# Patient Record
Sex: Male | Born: 1975 | Race: White | Hispanic: No | Marital: Married | Smoking: Former smoker
Health system: Southern US, Community
[De-identification: ages and names within clinical notes are randomized; demographics above are authoritative.]

## PROBLEM LIST (undated history)

## (undated) HISTORY — PX: SHOULDER SURGERY: SHX246

## (undated) HISTORY — PX: CLOSED REDUCTION NASAL FRACTURE: SUR256

## (undated) HISTORY — PX: TONSILLECTOMY: SUR1361

## (undated) HISTORY — PX: ANKLE FRACTURE SURGERY: SHX122

---

## 1998-04-09 ENCOUNTER — Ambulatory Visit (HOSPITAL_BASED_OUTPATIENT_CLINIC_OR_DEPARTMENT_OTHER): Admission: RE | Admit: 1998-04-09 | Discharge: 1998-04-09 | Payer: Self-pay | Admitting: Orthopedic Surgery

## 1999-06-14 ENCOUNTER — Emergency Department (HOSPITAL_COMMUNITY): Admission: EM | Admit: 1999-06-14 | Discharge: 1999-06-14 | Payer: Self-pay

## 2001-02-17 ENCOUNTER — Emergency Department (HOSPITAL_COMMUNITY): Admission: EM | Admit: 2001-02-17 | Discharge: 2001-02-18 | Payer: Self-pay | Admitting: *Deleted

## 2001-02-17 ENCOUNTER — Encounter: Payer: Self-pay | Admitting: *Deleted

## 2018-11-11 ENCOUNTER — Encounter: Payer: Self-pay | Admitting: Emergency Medicine

## 2018-11-11 DIAGNOSIS — F988 Other specified behavioral and emotional disorders with onset usually occurring in childhood and adolescence: Secondary | ICD-10-CM | POA: Insufficient documentation

## 2018-11-11 DIAGNOSIS — F32A Depression, unspecified: Secondary | ICD-10-CM | POA: Insufficient documentation

## 2018-11-11 DIAGNOSIS — F419 Anxiety disorder, unspecified: Secondary | ICD-10-CM

## 2018-11-11 DIAGNOSIS — F329 Major depressive disorder, single episode, unspecified: Secondary | ICD-10-CM | POA: Insufficient documentation

## 2018-11-15 ENCOUNTER — Other Ambulatory Visit: Payer: Self-pay

## 2018-11-15 MED ORDER — ALPRAZOLAM 0.5 MG PO TABS: 0.5000 mg | ORAL_TABLET | Freq: Every day | ORAL | 0 refills | Status: DC | PRN

## 2018-11-24 ENCOUNTER — Ambulatory Visit: Payer: Self-pay | Admitting: Psychiatry

## 2018-12-28 ENCOUNTER — Ambulatory Visit: Payer: Self-pay | Admitting: Psychiatry

## 2018-12-28 ENCOUNTER — Encounter: Payer: Self-pay | Admitting: Psychiatry

## 2018-12-28 VITALS — BP 122/69 | HR 59

## 2018-12-28 DIAGNOSIS — F329 Major depressive disorder, single episode, unspecified: Secondary | ICD-10-CM

## 2018-12-28 DIAGNOSIS — F32A Depression, unspecified: Secondary | ICD-10-CM

## 2018-12-28 DIAGNOSIS — F902 Attention-deficit hyperactivity disorder, combined type: Secondary | ICD-10-CM

## 2018-12-28 DIAGNOSIS — F411 Generalized anxiety disorder: Secondary | ICD-10-CM

## 2018-12-28 MED ORDER — AMPHETAMINE-DEXTROAMPHET ER 25 MG PO CP24: 25.0000 mg | ORAL_CAPSULE | ORAL | 0 refills | Status: DC

## 2018-12-28 MED ORDER — SILDENAFIL CITRATE 100 MG PO TABS: 50.0000 mg | ORAL_TABLET | Freq: Every day | ORAL | 0 refills | Status: DC | PRN

## 2018-12-28 MED ORDER — SERTRALINE HCL 50 MG PO TABS: 50.0000 mg | ORAL_TABLET | Freq: Every day | ORAL | 1 refills | Status: DC

## 2018-12-28 MED ORDER — ALPRAZOLAM 0.5 MG PO TABS: 0.5000 mg | ORAL_TABLET | Freq: Every day | ORAL | 0 refills | Status: DC | PRN

## 2018-12-28 MED ORDER — BUPROPION HCL ER (XL) 150 MG PO TB24: 150.0000 mg | ORAL_TABLET | ORAL | 1 refills | Status: DC
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
## 2018-12-29 ENCOUNTER — Encounter: Payer: Self-pay | Admitting: Psychiatry

## 2018-12-30 ENCOUNTER — Telehealth: Payer: Self-pay | Admitting: Psychiatry

## 2018-12-30 DIAGNOSIS — F411 Generalized anxiety disorder: Secondary | ICD-10-CM

## 2018-12-30 DIAGNOSIS — G2581 Restless legs syndrome: Secondary | ICD-10-CM

## 2018-12-30 DIAGNOSIS — G4700 Insomnia, unspecified: Secondary | ICD-10-CM

## 2018-12-30 MED ORDER — GABAPENTIN 300 MG PO CAPS: 300.0000 mg | ORAL_CAPSULE | Freq: Every day | ORAL | 3 refills | Status: DC

## 2018-12-30 NOTE — Telephone Encounter (Signed)
PATIENT LEFT VM STATING A SCRIPT WAS SUPPOSE TO BE CALLED INTO [facility] FOR HIM TO SLEEP, THE SCRIPT HAS NOT BEEN SENT PLEASE SENT TO [facility] ON [address], PT GOING OUT OF TOWN

## 2019-02-11 ENCOUNTER — Other Ambulatory Visit: Payer: Self-pay

## 2019-02-11 ENCOUNTER — Emergency Department (HOSPITAL_COMMUNITY)
Admission: EM | Admit: 2019-02-11 | Discharge: 2019-02-12 | Disposition: A | Discharge status: Home / Self Care | Payer: Self-pay | Attending: Emergency Medicine | Admitting: Emergency Medicine

## 2019-02-11 ENCOUNTER — Encounter (HOSPITAL_COMMUNITY): Payer: Self-pay

## 2019-02-11 DIAGNOSIS — Z79899 Other long term (current) drug therapy: Secondary | ICD-10-CM | POA: Insufficient documentation

## 2019-02-11 DIAGNOSIS — S0992XA Unspecified injury of nose, initial encounter: Secondary | ICD-10-CM

## 2019-02-11 DIAGNOSIS — Y999 Unspecified external cause status: Secondary | ICD-10-CM | POA: Insufficient documentation

## 2019-02-11 DIAGNOSIS — Z87891 Personal history of nicotine dependence: Secondary | ICD-10-CM | POA: Insufficient documentation

## 2019-02-11 DIAGNOSIS — Y939 Activity, unspecified: Secondary | ICD-10-CM | POA: Insufficient documentation

## 2019-02-11 DIAGNOSIS — S01112A Laceration without foreign body of left eyelid and periocular area, initial encounter: Secondary | ICD-10-CM | POA: Insufficient documentation

## 2019-02-11 DIAGNOSIS — S022XXA Fracture of nasal bones, initial encounter for closed fracture: Secondary | ICD-10-CM | POA: Insufficient documentation

## 2019-02-11 DIAGNOSIS — Z23 Encounter for immunization: Secondary | ICD-10-CM | POA: Insufficient documentation

## 2019-02-11 DIAGNOSIS — Y9289 Other specified places as the place of occurrence of the external cause: Secondary | ICD-10-CM | POA: Insufficient documentation

## 2019-02-11 MED ORDER — LIDOCAINE-EPINEPHRINE (PF) 2 %-1:200000 IJ SOLN
10.0000 mL | Freq: Once | INTRAMUSCULAR | Status: AC
  Administered 2019-02-12: 10 mL
  Filled 2019-02-11: qty 20

## 2019-02-11 MED ORDER — TETANUS-DIPHTH-ACELL PERTUSSIS 5-2.5-18.5 LF-MCG/0.5 IM SUSP
0.5000 mL | Freq: Once | INTRAMUSCULAR | Status: AC
  Administered 2019-02-12: 0.5 mL via INTRAMUSCULAR
  Filled 2019-02-11: qty 0.5
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
## 2019-02-11 NOTE — ED Notes (Signed)
Pt states he just "wants his nose fixed" so he can go

## 2019-02-12 ENCOUNTER — Emergency Department (HOSPITAL_COMMUNITY): Payer: Self-pay

## 2019-02-12 MED ORDER — IBUPROFEN 800 MG PO TABS
800.0000 mg | ORAL_TABLET | Freq: Once | ORAL | Status: AC
  Administered 2019-02-12: 800 mg via ORAL
  Filled 2019-02-12: qty 1

## 2019-02-12 MED ORDER — CEPHALEXIN 500 MG PO CAPS: 500.0000 mg | ORAL_CAPSULE | Freq: Four times a day (QID) | ORAL | 0 refills | Status: DC

## 2019-02-12 NOTE — Discharge Instructions (Addendum)
Take Keflex as prescribed until finished. Take Tylenol or ibuprofen for management of pain.  You do not need to have your stitches removed as they will dissolve on their own. Apply ice to areas of pain to limit bruising and swelling. Do not blow your nose for 1 week as this may worsen symptoms. We recommend follow-up with a specialist in 1 week to further evaluate your injuries.
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
## 2019-02-12 NOTE — ED Notes (Signed)
Patient transported to CT 

## 2019-02-14 ENCOUNTER — Other Ambulatory Visit: Payer: Self-pay | Admitting: Psychiatry

## 2019-02-14 DIAGNOSIS — F411 Generalized anxiety disorder: Secondary | ICD-10-CM

## 2019-03-13 ENCOUNTER — Other Ambulatory Visit: Payer: Self-pay | Admitting: Psychiatry

## 2019-03-13 DIAGNOSIS — F32A Depression, unspecified: Secondary | ICD-10-CM

## 2019-03-13 DIAGNOSIS — F329 Major depressive disorder, single episode, unspecified: Secondary | ICD-10-CM

## 2019-03-13 MED ORDER — BUPROPION HCL ER (XL) 150 MG PO TB24: 150.0000 mg | ORAL_TABLET | ORAL | 0 refills | Status: DC

## 2019-03-29 ENCOUNTER — Ambulatory Visit: Payer: Self-pay | Admitting: Psychiatry

## 2019-03-31 ENCOUNTER — Other Ambulatory Visit: Payer: Self-pay | Admitting: Psychiatry

## 2019-04-01 NOTE — Telephone Encounter (Signed)
Has appt 04/21

## 2019-04-04 ENCOUNTER — Ambulatory Visit (INDEPENDENT_AMBULATORY_CARE_PROVIDER_SITE_OTHER): Payer: Managed Care, Other (non HMO) | Admitting: Psychiatry

## 2019-04-04 ENCOUNTER — Encounter: Payer: Self-pay | Admitting: Psychiatry

## 2019-04-04 ENCOUNTER — Other Ambulatory Visit: Payer: Self-pay

## 2019-04-04 DIAGNOSIS — G2581 Restless legs syndrome: Secondary | ICD-10-CM

## 2019-04-04 DIAGNOSIS — F411 Generalized anxiety disorder: Secondary | ICD-10-CM

## 2019-04-04 DIAGNOSIS — G4700 Insomnia, unspecified: Secondary | ICD-10-CM

## 2019-04-04 DIAGNOSIS — F902 Attention-deficit hyperactivity disorder, combined type: Secondary | ICD-10-CM | POA: Diagnosis not present

## 2019-04-04 MED ORDER — AMPHETAMINE-DEXTROAMPHET ER 25 MG PO CP24: 25.0000 mg | ORAL_CAPSULE | ORAL | 0 refills | Status: DC

## 2019-04-04 MED ORDER — SERTRALINE HCL 50 MG PO TABS: 50.0000 mg | ORAL_TABLET | Freq: Every day | ORAL | 1 refills | Status: DC

## 2019-04-04 MED ORDER — GABAPENTIN 300 MG PO CAPS: 300.0000 mg | ORAL_CAPSULE | Freq: Every day | ORAL | 2 refills | Status: DC

## 2019-04-04 MED ORDER — ALPRAZOLAM 0.5 MG PO TABS: 0.5000 mg | ORAL_TABLET | Freq: Every day | ORAL | 1 refills | Status: DC | PRN
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
## 2019-04-08 ENCOUNTER — Encounter: Payer: Self-pay | Admitting: Psychiatry

## 2019-04-18 ENCOUNTER — Other Ambulatory Visit: Payer: Self-pay

## 2019-04-18 MED ORDER — SILDENAFIL CITRATE 50 MG PO TABS: 50.0000 mg | ORAL_TABLET | ORAL | 0 refills | Status: DC | PRN

## 2019-04-18 NOTE — Telephone Encounter (Signed)
Pt requesting a refill on his Viagra and asking for Wellbutrin be sent to [facility]. He was told to call back if he needed.

## 2019-07-04 ENCOUNTER — Ambulatory Visit: Payer: Managed Care, Other (non HMO) | Admitting: Psychiatry

## 2019-07-12 ENCOUNTER — Other Ambulatory Visit: Payer: Self-pay | Admitting: Psychiatry

## 2019-07-12 DIAGNOSIS — F902 Attention-deficit hyperactivity disorder, combined type: Secondary | ICD-10-CM

## 2019-07-12 NOTE — Telephone Encounter (Signed)
Last fill 06/26

## 2019-07-17 ENCOUNTER — Encounter: Payer: Self-pay | Admitting: Psychiatry

## 2019-07-17 ENCOUNTER — Other Ambulatory Visit: Payer: Self-pay

## 2019-07-17 ENCOUNTER — Ambulatory Visit (INDEPENDENT_AMBULATORY_CARE_PROVIDER_SITE_OTHER): Payer: Managed Care, Other (non HMO) | Admitting: Psychiatry

## 2019-07-17 DIAGNOSIS — F32A Depression, unspecified: Secondary | ICD-10-CM

## 2019-07-17 DIAGNOSIS — F411 Generalized anxiety disorder: Secondary | ICD-10-CM

## 2019-07-17 DIAGNOSIS — F329 Major depressive disorder, single episode, unspecified: Secondary | ICD-10-CM

## 2019-07-17 DIAGNOSIS — F902 Attention-deficit hyperactivity disorder, combined type: Secondary | ICD-10-CM | POA: Diagnosis not present

## 2019-07-17 MED ORDER — SILDENAFIL CITRATE 100 MG PO TABS: 50.0000 mg | ORAL_TABLET | ORAL | 5 refills | Status: DC | PRN

## 2019-07-17 MED ORDER — AMPHETAMINE-DEXTROAMPHET ER 25 MG PO CP24: ORAL_CAPSULE | ORAL | 0 refills | Status: DC

## 2019-07-17 MED ORDER — AMPHETAMINE-DEXTROAMPHET ER 25 MG PO CP24: 25.0000 mg | ORAL_CAPSULE | ORAL | 0 refills | Status: DC

## 2019-07-17 MED ORDER — ALPRAZOLAM 0.5 MG PO TABS: 0.5000 mg | ORAL_TABLET | Freq: Every day | ORAL | 2 refills | Status: DC | PRN

## 2019-07-17 MED ORDER — BUPROPION HCL ER (XL) 150 MG PO TB24: 150.0000 mg | ORAL_TABLET | Freq: Every day | ORAL | 0 refills | Status: DC

## 2019-07-17 MED ORDER — SERTRALINE HCL 50 MG PO TABS: 50.0000 mg | ORAL_TABLET | Freq: Every day | ORAL | 1 refills | Status: DC

## 2019-07-17 NOTE — Progress Notes (Signed)
Aaron West [id number] 05-Nov-1976 43 y.o.  Virtual Visit via Telephone Note  I connected with pt on 07/17/19 at 11:30 AM EDT by telephone and verified that I am speaking with the correct person using two identifiers.   I discussed the limitations, risks, security and privacy concerns of performing an evaluation and management service by telephone and the availability of in person appointments. I also discussed with the patient that there may be a patient responsible charge related to this service. The patient expressed understanding and agreed to proceed.   I discussed the assessment and treatment plan with the patient. The patient was provided an opportunity to ask questions and all were answered. The patient agreed with the plan and demonstrated an understanding of the instructions.   The patient was advised to call back or seek an in-person evaluation if the symptoms worsen or if the condition fails to improve as anticipated.  I provided 20 minutes of non-face-to-face time during this encounter.  The patient was located at home.  The provider was located at [place].   Thayer Headings, PMHNP   Subjective:   Patient ID:  Aaron West is a 43 y.o. (DOB 1976-07-26) male.  Chief Complaint:  Chief Complaint  Patient presents with  . Follow-up    ADD, Anxiety, h/o Depression    HPI Aaron West presents for follow-up of ADD, mood, and anxiety. He denies complaints. Reports that anxiety has been well-controlled. Mood has been stable. Denies depressed mood or irritability. Reports that he has not been exercising as often. Energy and motivation ok overall and thinks energy would be better if he exercised regularly. He reports adequate sleep. Reports taking Gabapentin prn when RLS and insomnia occurs. Appetite is stable. He reports adequate concentration. Denies SI.   Work remains very busy.    Review of Systems:  Review of Systems  Cardiovascular: Negative for  palpitations.  Musculoskeletal: Negative for gait problem.  Neurological: Negative for tremors.  Psychiatric/Behavioral:       Please refer to HPI    Medications: I have reviewed the patient's current medications.  Current Outpatient Medications  Medication Sig Dispense Refill  . [START ON 10/04/2019] amphetamine-dextroamphetamine (ADDERALL XR) 25 MG 24 hr capsule TAKE ONE CAPSULE BY MOUTH IN THE MORNING 30 capsule 0  . buPROPion (WELLBUTRIN XL) 150 MG 24 hr tablet Take 1 tablet (150 mg total) by mouth daily. 90 tablet 0  . Multiple Vitamin (MULTIVITAMIN) tablet Take 1 tablet by mouth daily.    . sildenafil (VIAGRA) 100 MG tablet Take 0.5 tablets (50 mg total) by mouth as needed for erectile dysfunction. 10 tablet 5  . ALPRAZolam (XANAX) 0.5 MG tablet Take 1 tablet (0.5 mg total) by mouth daily as needed for anxiety. 30 tablet 2  . [START ON 09/06/2019] amphetamine-dextroamphetamine (ADDERALL XR) 25 MG 24 hr capsule Take 1 capsule by mouth every morning. 30 capsule 0  . amphetamine-dextroamphetamine (ADDERALL XR) 25 MG 24 hr capsule Take 1 capsule by mouth every morning. 30 capsule 0  . gabapentin (NEURONTIN) 300 MG capsule Take 1 capsule (300 mg total) by mouth at bedtime for 30 days. 30 capsule 2  . sertraline (ZOLOFT) 50 MG tablet Take 1 tablet (50 mg total) by mouth at bedtime. 90 tablet 1   No current facility-administered medications for this visit.     Medication Side Effects: None  Allergies:  Allergies  Allergen Reactions  . Penicillins   . Trazodone And Nefazodone     No past medical history  on file.  Family History  Problem Relation Age of Onset  . ADD / ADHD Brother   . Anxiety disorder Brother   . Mental illness Maternal Uncle   . Mental illness Cousin   . ADD / ADHD Daughter   . Depression Daughter   . Drug abuse Daughter   . Depression Daughter     Social History   Socioeconomic History  . Marital status: Married    Spouse name: Not on file  . Number  of children: Not on file  . Years of education: Not on file  . Highest education level: Not on file  Occupational History  . Not on file  Social Needs  . Financial resource strain: Not on file  . Food insecurity    Worry: Not on file    Inability: Not on file  . Transportation needs    Medical: Not on file    Non-medical: Not on file  Tobacco Use  . Smoking status: Former Games developermoker  . Smokeless tobacco: Never Used  Substance and Sexual Activity  . Alcohol use: Yes  . Drug use: Yes    Types: Marijuana  . Sexual activity: Not on file  Lifestyle  . Physical activity    Days per week: Not on file    Minutes per session: Not on file  . Stress: Not on file  Relationships  . Social Musicianconnections    Talks on phone: Not on file    Gets together: Not on file    Attends religious service: Not on file    Active member of club or organization: Not on file    Attends meetings of clubs or organizations: Not on file    Relationship status: Not on file  . Intimate partner violence    Fear of current or ex partner: Not on file    Emotionally abused: Not on file    Physically abused: Not on file    Forced sexual activity: Not on file  Other Topics Concern  . Not on file  Social History Narrative  . Not on file    Past Medical History, Surgical history, Social history, and Family history were reviewed and updated as appropriate.   Please see review of systems for further details on the patient's review from today.   Objective:   Physical Exam:  There were no vitals taken for this visit.  Physical Exam Neurological:     Mental Status: He is alert and oriented to person, place, and time.     Cranial Nerves: No dysarthria.  Psychiatric:        Attention and Perception: Attention normal.        Mood and Affect: Mood normal.        Speech: Speech normal.        Behavior: Behavior is cooperative.        Thought Content: Thought content normal. Thought content is not paranoid or  delusional. Thought content does not include homicidal or suicidal ideation. Thought content does not include homicidal or suicidal plan.        Cognition and Memory: Cognition and memory normal.        Judgment: Judgment normal.     Lab Review:  No results found for: NA, K, CL, CO2, GLUCOSE, BUN, CREATININE, CALCIUM, PROT, ALBUMIN, AST, ALT, ALKPHOS, BILITOT, GFRNONAA, GFRAA  No results found for: WBC, RBC, HGB, HCT, PLT, MCV, MCH, MCHC, RDW, LYMPHSABS, MONOABS, EOSABS, BASOSABS  No results found for: POCLITH, LITHIUM  No results found for: PHENYTOIN, PHENOBARB, VALPROATE, CBMZ   .res Assessment: Plan:   Will continue current plan of care since patient reports that medications are effectively treating target signs and symptoms without any tolerability issues. Patient to follow-up in 3 months or sooner if clinically indicated. Patient advised to contact office with any questions, adverse effects, or acute worsening in signs and symptoms.  Aaron West was seen today for follow-up.  Diagnoses and all orders for this visit:  Depression, unspecified depression type -     buPROPion (WELLBUTRIN XL) 150 MG 24 hr tablet; Take 1 tablet (150 mg total) by mouth daily.  Generalized anxiety disorder -     ALPRAZolam (XANAX) 0.5 MG tablet; Take 1 tablet (0.5 mg total) by mouth daily as needed for anxiety. -     sertraline (ZOLOFT) 50 MG tablet; Take 1 tablet (50 mg total) by mouth at bedtime.  Attention deficit hyperactivity disorder (ADHD), combined type -     amphetamine-dextroamphetamine (ADDERALL XR) 25 MG 24 hr capsule; TAKE ONE CAPSULE BY MOUTH IN THE MORNING -     amphetamine-dextroamphetamine (ADDERALL XR) 25 MG 24 hr capsule; Take 1 capsule by mouth every morning. -     amphetamine-dextroamphetamine (ADDERALL XR) 25 MG 24 hr capsule; Take 1 capsule by mouth every morning.  Other orders -     sildenafil (VIAGRA) 100 MG tablet; Take 0.5 tablets (50 mg total) by mouth as needed for erectile  dysfunction.    Please see After Visit Summary for patient specific instructions.  No future appointments.  No orders of the defined types were placed in this encounter.     -------------------------------

## 2019-10-16 ENCOUNTER — Other Ambulatory Visit: Payer: Self-pay | Admitting: Psychiatry

## 2019-10-16 DIAGNOSIS — F411 Generalized anxiety disorder: Secondary | ICD-10-CM

## 2019-10-17 NOTE — Telephone Encounter (Signed)
Due back this month

## 2019-10-20 ENCOUNTER — Other Ambulatory Visit: Payer: Self-pay | Admitting: Psychiatry

## 2019-10-20 DIAGNOSIS — F329 Major depressive disorder, single episode, unspecified: Secondary | ICD-10-CM

## 2019-10-20 DIAGNOSIS — F32A Depression, unspecified: Secondary | ICD-10-CM

## 2019-11-22 ENCOUNTER — Other Ambulatory Visit: Payer: Self-pay | Admitting: Psychiatry

## 2019-11-22 DIAGNOSIS — F411 Generalized anxiety disorder: Secondary | ICD-10-CM

## 2019-11-22 DIAGNOSIS — F902 Attention-deficit hyperactivity disorder, combined type: Secondary | ICD-10-CM

## 2019-11-23 ENCOUNTER — Other Ambulatory Visit: Payer: Self-pay

## 2019-11-23 DIAGNOSIS — F902 Attention-deficit hyperactivity disorder, combined type: Secondary | ICD-10-CM

## 2019-11-23 MED ORDER — AMPHETAMINE-DEXTROAMPHET ER 25 MG PO CP24: ORAL_CAPSULE | ORAL | 0 refills | Status: DC

## 2019-11-23 NOTE — Telephone Encounter (Signed)
Pt requesting a refill on his Adderall to be filled at [facility] on [address]. He stated he would like to speak with Janett Billow also concerning the effectiveness of the Adderall.Marland Kitchen He states he might need something else, but he is out of Adderall and something is better than nothing.

## 2019-12-27 ENCOUNTER — Other Ambulatory Visit: Payer: Self-pay | Admitting: Psychiatry

## 2019-12-27 DIAGNOSIS — F411 Generalized anxiety disorder: Secondary | ICD-10-CM

## 2019-12-28 ENCOUNTER — Other Ambulatory Visit: Payer: Self-pay

## 2019-12-28 ENCOUNTER — Telehealth: Payer: Self-pay | Admitting: Psychiatry

## 2019-12-28 DIAGNOSIS — F902 Attention-deficit hyperactivity disorder, combined type: Secondary | ICD-10-CM

## 2019-12-28 MED ORDER — AMPHETAMINE-DEXTROAMPHET ER 25 MG PO CP24: 25.0000 mg | ORAL_CAPSULE | ORAL | 0 refills | Status: DC

## 2019-12-28 NOTE — Telephone Encounter (Signed)
Apt 01/04/2020

## 2019-12-28 NOTE — Telephone Encounter (Signed)
Last refill 11/27/2019 Apt 01/04/2020 Pended for Shanda Bumps to submit

## 2019-12-28 NOTE — Telephone Encounter (Signed)
Patient called and said thatt he needs a refill on his adderall xr 25 mg to be sent tot he [facility] [address].

## 2020-01-04 ENCOUNTER — Ambulatory Visit (INDEPENDENT_AMBULATORY_CARE_PROVIDER_SITE_OTHER): Payer: BC Managed Care – PPO | Admitting: Psychiatry

## 2020-01-04 ENCOUNTER — Encounter: Payer: Self-pay | Admitting: Psychiatry

## 2020-01-04 VITALS — HR 75 | Wt 205.0 lb

## 2020-01-04 DIAGNOSIS — F902 Attention-deficit hyperactivity disorder, combined type: Secondary | ICD-10-CM

## 2020-01-04 DIAGNOSIS — F329 Major depressive disorder, single episode, unspecified: Secondary | ICD-10-CM

## 2020-01-04 DIAGNOSIS — F32A Depression, unspecified: Secondary | ICD-10-CM

## 2020-01-04 DIAGNOSIS — F411 Generalized anxiety disorder: Secondary | ICD-10-CM | POA: Diagnosis not present

## 2020-01-04 DIAGNOSIS — G4700 Insomnia, unspecified: Secondary | ICD-10-CM | POA: Diagnosis not present

## 2020-01-04 MED ORDER — AMPHETAMINE-DEXTROAMPHETAMINE 15 MG PO TABS: 15.0000 mg | ORAL_TABLET | Freq: Two times a day (BID) | ORAL | 0 refills | Status: DC

## 2020-01-04 MED ORDER — VIIBRYD STARTER PACK 10 & 20 MG PO KIT: PACK | ORAL | 0 refills | Status: DC

## 2020-01-04 MED ORDER — ALPRAZOLAM 0.5 MG PO TABS: ORAL_TABLET | ORAL | 0 refills | Status: DC

## 2020-01-04 MED ORDER — SERTRALINE HCL 50 MG PO TABS: 50.0000 mg | ORAL_TABLET | Freq: Every day | ORAL | 1 refills | Status: DC

## 2020-01-04 MED ORDER — BUPROPION HCL ER (XL) 150 MG PO TB24: 150.0000 mg | ORAL_TABLET | Freq: Every day | ORAL | 0 refills | Status: DC

## 2020-01-04 NOTE — Progress Notes (Signed)
Aaron West [id number] 03/18/1976 44 y.o.  Virtual Visit via Telephone Note  I connected with pt on 01/04/20 at  2:00 PM EST by telephone and verified that I am speaking with the correct person using two identifiers.   I discussed the limitations, risks, security and privacy concerns of performing an evaluation and management service by telephone and the availability of in person appointments. I also discussed with the patient that there may be a patient responsible charge related to this service. The patient expressed understanding and agreed to proceed.   I discussed the assessment and treatment plan with the patient. The patient was provided an opportunity to ask questions and all were answered. The patient agreed with the plan and demonstrated an understanding of the instructions.   The patient was advised to call back or seek an in-person evaluation if the symptoms worsen or if the condition fails to improve as anticipated.  I provided 30 minutes of non-face-to-face time during this encounter.  The patient was located at home.  The provider was located at [place].   Thayer Headings, PMHNP   Subjective:   Patient ID:  Aaron West is a 44 y.o. (DOB 1976-10-13) male.  Chief Complaint:  Chief Complaint  Patient presents with  . Depression  . ADD  . Anxiety    HPI Aaron West presents for follow-up of ADD, Anxiety, and Depression. He inquires about pharmacogenetic testing. He reports that he has had some depression recently despite lifestyle changes that have helped in the past, such as exercising and not drinking ETOH. He reports that his energy and motivation have been lower. He reports that he recently tried Adderall IR 15 mg 1-2 times daily and this seemed to be more effective for his concentration, energy, and motivation than XR. He has had some anxiety and takes Xanax 1/2 tab most days. Reports that he has lost about 10 lbs with working out regularly. Appetite  has been stable. Denies SI.   He reports that he tested positive for covid last week. He reports that he had a brief course of prednisone and noticed improved energy, motivation, and elevated mood for a few days.  He reports that he has not had contact with his ex-wife for a longer period time and that this has caused him some anxiety and sadness. Has been working out 3 days a week.   Past Psychiatric Medication Trials: Wellbutrin- Feels that 150 mg po qd has been helpful for irritability. Cannot tolerate 300 mg.  Sertraline- Has been helpful. Questioned if it caused wt. Gain  Prozac- Had adverse effects Doxepin Trazodone Gabapentin- helpful for RLS May have taken Lexapro  Review of Systems:  Review of Systems  Cardiovascular: Negative for palpitations.  Musculoskeletal: Negative for gait problem.  Neurological: Negative for tremors and headaches.  Psychiatric/Behavioral:       Please refer to HPI      Medications: I have reviewed the patient's current medications.  Current Outpatient Medications  Medication Sig Dispense Refill  . [START ON 01/25/2020] ALPRAZolam (XANAX) 0.5 MG tablet TAKE ONE TABLET BY MOUTH DAILY AS NEEDED FOR ANXIETY 30 tablet 0  . buPROPion (WELLBUTRIN XL) 150 MG 24 hr tablet Take 1 tablet (150 mg total) by mouth daily. 90 tablet 0  . Multiple Vitamin (MULTIVITAMIN) tablet Take 1 tablet by mouth daily.    . sildenafil (VIAGRA) 100 MG tablet Take 0.5 tablets (50 mg total) by mouth as needed for erectile dysfunction. 10 tablet 5  . [START ON  01/25/2020] amphetamine-dextroamphetamine (ADDERALL) 15 MG tablet Take 1 tablet by mouth 2 (two) times daily. 60 tablet 0  . gabapentin (NEURONTIN) 300 MG capsule Take 1 capsule (300 mg total) by mouth at bedtime for 30 days. (Patient taking differently: Take 300 mg by mouth at bedtime as needed (RLS). ) 30 capsule 2  . sertraline (ZOLOFT) 50 MG tablet Take 1 tablet (50 mg total) by mouth at bedtime. Decrease to 25 mg po qd x  1 week, then stop. 90 tablet 1  . Vilazodone HCl (VIIBRYD STARTER PACK) 10 & 20 MG KIT Take 10 mg by mouth daily for 7 days, THEN 20 mg daily for 28 days. 3 kit 0   No current facility-administered medications for this visit.    Medication Side Effects: None  Allergies:  Allergies  Allergen Reactions  . Penicillins   . Trazodone And Nefazodone     History reviewed. No pertinent past medical history.  Family History  Problem Relation Age of Onset  . ADD / ADHD Brother   . Anxiety disorder Brother   . Mental illness Maternal Uncle   . Mental illness Cousin   . ADD / ADHD Daughter   . Depression Daughter   . Drug abuse Daughter   . Depression Daughter     Social History   Socioeconomic History  . Marital status: Married    Spouse name: Not on file  . Number of children: Not on file  . Years of education: Not on file  . Highest education level: Not on file  Occupational History  . Not on file  Tobacco Use  . Smoking status: Former Research scientist (life sciences)  . Smokeless tobacco: Never Used  Substance and Sexual Activity  . Alcohol use: Yes  . Drug use: Yes    Types: Marijuana  . Sexual activity: Not on file  Other Topics Concern  . Not on file  Social History Narrative  . Not on file   Social Determinants of Health   Financial Resource Strain:   . Difficulty of Paying Living Expenses: Not on file  Food Insecurity:   . Worried About Charity fundraiser in the Last Year: Not on file  . Ran Out of Food in the Last Year: Not on file  Transportation Needs:   . Lack of Transportation (Medical): Not on file  . Lack of Transportation (Non-Medical): Not on file  Physical Activity:   . Days of Exercise per Week: Not on file  . Minutes of Exercise per Session: Not on file  Stress:   . Feeling of Stress : Not on file  Social Connections:   . Frequency of Communication with Friends and Family: Not on file  . Frequency of Social Gatherings with Friends and Family: Not on file  .  Attends Religious Services: Not on file  . Active Member of Clubs or Organizations: Not on file  . Attends Archivist Meetings: Not on file  . Marital Status: Not on file  Intimate Partner Violence:   . Fear of Current or Ex-Partner: Not on file  . Emotionally Abused: Not on file  . Physically Abused: Not on file  . Sexually Abused: Not on file    Past Medical History, Surgical history, Social history, and Family history were reviewed and updated as appropriate.   Please see review of systems for further details on the patient's review from today.   Objective:   Physical Exam:  Pulse 75   Wt 205 lb (93 kg)  BMI 26.32 kg/m   Physical Exam Neurological:     Mental Status: He is alert and oriented to person, place, and time.     Cranial Nerves: No dysarthria.  Psychiatric:        Attention and Perception: Attention and perception normal.        Mood and Affect: Mood normal.        Speech: Speech normal.        Behavior: Behavior is cooperative.        Thought Content: Thought content normal. Thought content is not paranoid or delusional. Thought content does not include homicidal or suicidal ideation. Thought content does not include homicidal or suicidal plan.        Cognition and Memory: Cognition and memory normal.        Judgment: Judgment normal.     Comments: Insight intact     Lab Review:  No results found for: NA, K, CL, CO2, GLUCOSE, BUN, CREATININE, CALCIUM, PROT, ALBUMIN, AST, ALT, ALKPHOS, BILITOT, GFRNONAA, GFRAA  No results found for: WBC, RBC, HGB, HCT, PLT, MCV, MCH, MCHC, RDW, LYMPHSABS, MONOABS, EOSABS, BASOSABS  No results found for: POCLITH, LITHIUM   No results found for: PHENYTOIN, PHENOBARB, VALPROATE, CBMZ   .res Assessment: Plan:   Patient seen for 30 minutes and time spent counseling patient and answering his questions about pharmacogenetics testing and other treatment options for depression.  Discussed different mechanisms of action  of SSRIs, SNRIs, Viibryd, and Wellbutrin.  Discussed potential benefits, risks and side effects of Viibryd and patient agrees to trial of Viibryd.  Discussed decreasing sertraline to 25 mg daily for 1 week when starting Viibryd to minimize risk of discontinuation signs and symptoms, and then stop Viibryd.  Advised patient to start Viibryd 10 mg daily with a meal for 1 week, then increase to 10 mg daily for depression and anxiety.  Will provide patient with samples Will change Adderall extended release to Adderall immediate release since patient reports that immediate release has been more effective for him.  Discussed that prescription will be sent for Adderall immediate release that can be started when current supply of extended release is completed. Continue Wellbutrin XL 150 mg daily for depression. Patient to follow-up in 4 to 6 weeks or sooner if clinically indicated. Patient advised to contact office with any questions, adverse effects, or acute worsening in signs and symptoms.  Abdulahad was seen today for depression, add and anxiety.  Diagnoses and all orders for this visit:  Attention deficit hyperactivity disorder (ADHD), combined type -     amphetamine-dextroamphetamine (ADDERALL) 15 MG tablet; Take 1 tablet by mouth 2 (two) times daily.  Generalized anxiety disorder -     sertraline (ZOLOFT) 50 MG tablet; Take 1 tablet (50 mg total) by mouth at bedtime. Decrease to 25 mg po qd x 1 week, then stop. -     Vilazodone HCl (VIIBRYD STARTER PACK) 10 & 20 MG KIT; Take 10 mg by mouth daily for 7 days, THEN 20 mg daily for 28 days. -     ALPRAZolam (XANAX) 0.5 MG tablet; TAKE ONE TABLET BY MOUTH DAILY AS NEEDED FOR ANXIETY  Depression, unspecified depression type -     buPROPion (WELLBUTRIN XL) 150 MG 24 hr tablet; Take 1 tablet (150 mg total) by mouth daily.  Insomnia, unspecified type    Please see After Visit Summary for patient specific instructions.  No future appointments.  No  orders of the defined types were placed in this  encounter.     -------------------------------

## 2020-02-15 ENCOUNTER — Other Ambulatory Visit: Payer: Self-pay

## 2020-02-15 ENCOUNTER — Telehealth: Payer: Self-pay | Admitting: Psychiatry

## 2020-02-15 MED ORDER — VIIBRYD 20 MG PO TABS: 20.0000 mg | ORAL_TABLET | Freq: Every day | ORAL | 0 refills | Status: DC

## 2020-02-15 NOTE — Telephone Encounter (Signed)
Patient called and would like his viibryd 20 mg to be sent to the [facility] on [address]. He has an appointment  On 3/18

## 2020-02-15 NOTE — Telephone Encounter (Signed)
RX for Viibryd 20 mg 1 daily #90, no refills submitted to [facility]. Insurance should also require a prior authorization to be completed as well.

## 2020-02-19 ENCOUNTER — Telehealth: Payer: Self-pay

## 2020-02-19 NOTE — Telephone Encounter (Signed)
Prior authorization submitted for VIIBRYD 20 mg through [employer] approved effective 02/19/2020-02/18/2021.   Submitted through cover my meds

## 2020-02-26 ENCOUNTER — Other Ambulatory Visit: Payer: Self-pay

## 2020-02-26 ENCOUNTER — Telehealth: Payer: Self-pay | Admitting: Psychiatry

## 2020-02-26 DIAGNOSIS — F411 Generalized anxiety disorder: Secondary | ICD-10-CM

## 2020-02-26 DIAGNOSIS — F902 Attention-deficit hyperactivity disorder, combined type: Secondary | ICD-10-CM

## 2020-02-26 NOTE — Telephone Encounter (Signed)
Aaron West called to request refill of her Adderall and Xanax.  Has appt 3/18 but is out now or will be before appt.  Please send to [facility].

## 2020-02-26 NOTE — Telephone Encounter (Signed)
Last refill for both on 01/27/2020 Pended both for Shanda Bumps to submit

## 2020-02-27 MED ORDER — ALPRAZOLAM 0.5 MG PO TABS: ORAL_TABLET | ORAL | 0 refills | Status: DC

## 2020-02-27 MED ORDER — AMPHETAMINE-DEXTROAMPHETAMINE 15 MG PO TABS: 15.0000 mg | ORAL_TABLET | Freq: Two times a day (BID) | ORAL | 0 refills | Status: DC

## 2020-02-29 ENCOUNTER — Ambulatory Visit: Payer: BC Managed Care – PPO | Admitting: Psychiatry

## 2020-03-04 ENCOUNTER — Encounter: Payer: Self-pay | Admitting: Psychiatry

## 2020-03-04 ENCOUNTER — Ambulatory Visit (INDEPENDENT_AMBULATORY_CARE_PROVIDER_SITE_OTHER): Payer: BC Managed Care – PPO | Admitting: Psychiatry

## 2020-03-04 ENCOUNTER — Other Ambulatory Visit: Payer: Self-pay

## 2020-03-04 VITALS — BP 118/63 | HR 79 | Wt 210.0 lb

## 2020-03-04 DIAGNOSIS — F902 Attention-deficit hyperactivity disorder, combined type: Secondary | ICD-10-CM

## 2020-03-04 DIAGNOSIS — F411 Generalized anxiety disorder: Secondary | ICD-10-CM | POA: Diagnosis not present

## 2020-03-04 DIAGNOSIS — F32A Depression, unspecified: Secondary | ICD-10-CM

## 2020-03-04 DIAGNOSIS — F329 Major depressive disorder, single episode, unspecified: Secondary | ICD-10-CM

## 2020-03-04 MED ORDER — AMPHETAMINE-DEXTROAMPHETAMINE 15 MG PO TABS: 15.0000 mg | ORAL_TABLET | Freq: Two times a day (BID) | ORAL | 0 refills | Status: DC

## 2020-03-04 MED ORDER — ALPRAZOLAM 0.5 MG PO TABS: ORAL_TABLET | ORAL | 2 refills | Status: DC

## 2020-03-04 MED ORDER — VIIBRYD 20 MG PO TABS: 20.0000 mg | ORAL_TABLET | Freq: Every day | ORAL | 0 refills | Status: DC

## 2020-03-04 NOTE — Progress Notes (Signed)
Aaron West [id number] [birth year] 44 y.o.  Subjective:   Patient ID:  Aaron West is a 44 y.o. (DOB Apr 11, 1976) male.  Chief Complaint:  Chief Complaint  Patient presents with  . Follow-up    h/o Depression, Anxiety, insomnia, ADD    HPI Aaron West presents to the office today for follow-up of anxiety, depression, and ADD. He reports that Viibryd has been helping with mood and anxiety. "I feel good." Denies any significant anxiety. Occ sadness at times. He continues to miss his ex-wife. Denies irritability. Sleep has been ok with some middle of the night awakenings. Denies difficulty falling asleep. Unsure if melatonin is effective. Occ restless legs when trying to fall asleep and will take Gabapentin and reports that this is infrequent (about once a week). Denies restless legs waking him up during the night. Appetite has been normal. Energy and motivation has been ok but continues to feel tired and sleepy in the afternoon. Some days fatigue lasts for the remainder of the day. He reports that Adderall IR seems to be more effective for concentration. Typically only taking Adderall on the days he works or will take one in the morning. Denies SI.   He reports that he is contemplating decreasing ETOH use. Has been training and exercising regularly.   Work has been busy. His children have been doing well.   Reports using Xanax prn most days.    Past Psychiatric Medication Trials: Wellbutrin- Feels that 150 mg po qd has been helpful for irritability. Cannot tolerate 300 mg.  Sertraline- Has been helpful. Questioned if it caused wt. Gain  Viibryd Prozac- Had adverse effects Doxepin Trazodone Gabapentin- helpful for RLS May have taken Lexapro  Review of Systems:  Review of Systems  Cardiovascular: Negative for palpitations.  Musculoskeletal: Negative for gait problem.  Neurological: Negative for tremors.  Psychiatric/Behavioral:       Please refer to HPI    Medications: I have  reviewed the patient's current medications.  Current Outpatient Medications  Medication Sig Dispense Refill  . [START ON 03/26/2020] ALPRAZolam (XANAX) 0.5 MG tablet TAKE ONE TABLET BY MOUTH DAILY AS NEEDED FOR ANXIETY 30 tablet 2  . [START ON 05/22/2020] amphetamine-dextroamphetamine (ADDERALL) 15 MG tablet Take 1 tablet by mouth 2 (two) times daily. 60 tablet 0  . buPROPion (WELLBUTRIN XL) 150 MG 24 hr tablet Take 1 tablet (150 mg total) by mouth daily. 90 tablet 0  . Multiple Vitamin (MULTIVITAMIN) tablet Take 1 tablet by mouth daily.    . sildenafil (VIAGRA) 100 MG tablet Take 0.5 tablets (50 mg total) by mouth as needed for erectile dysfunction. 10 tablet 5  . Vilazodone HCl (VIIBRYD) 20 MG TABS Take 1 tablet (20 mg total) by mouth daily. 90 tablet 0  . [START ON 04/24/2020] amphetamine-dextroamphetamine (ADDERALL) 15 MG tablet Take 1 tablet by mouth 2 (two) times daily. 60 tablet 0  . [START ON 03/27/2020] amphetamine-dextroamphetamine (ADDERALL) 15 MG tablet Take 1 tablet by mouth 2 (two) times daily. 60 tablet 0  . gabapentin (NEURONTIN) 300 MG capsule Take 1 capsule (300 mg total) by mouth at bedtime for 30 days. (Patient taking differently: Take 300 mg by mouth at bedtime as needed (RLS). ) 30 capsule 2   No current facility-administered medications for this visit.    Medication Side Effects: None  Allergies:  Allergies  Allergen Reactions  . Penicillins   . Trazodone And Nefazodone     History reviewed. No pertinent past medical history.  Family History  Problem Relation  Age of Onset  . ADD / ADHD Brother   . Anxiety disorder Brother   . Mental illness Maternal Uncle   . Mental illness Cousin   . ADD / ADHD Daughter   . Depression Daughter   . Drug abuse Daughter   . Depression Daughter     Social History   Socioeconomic History  . Marital status: Married    Spouse name: Not on file  . Number of children: Not on file  . Years of education: Not on file  . Highest  education level: Not on file  Occupational History  . Not on file  Tobacco Use  . Smoking status: Former Games developer  . Smokeless tobacco: Never Used  Substance and Sexual Activity  . Alcohol use: Yes  . Drug use: Yes    Types: Marijuana  . Sexual activity: Not on file  Other Topics Concern  . Not on file  Social History Narrative  . Not on file   Social Determinants of Health   Financial Resource Strain:   . Difficulty of Paying Living Expenses:   Food Insecurity:   . Worried About Programme researcher, broadcasting/film/video in the Last Year:   . Barista in the Last Year:   Transportation Needs:   . Freight forwarder (Medical):   Marland Kitchen Lack of Transportation (Non-Medical):   Physical Activity:   . Days of Exercise per Week:   . Minutes of Exercise per Session:   Stress:   . Feeling of Stress :   Social Connections:   . Frequency of Communication with Friends and Family:   . Frequency of Social Gatherings with Friends and Family:   . Attends Religious Services:   . Active Member of Clubs or Organizations:   . Attends Banker Meetings:   Marland Kitchen Marital Status:   Intimate Partner Violence:   . Fear of Current or Ex-Partner:   . Emotionally Abused:   Marland Kitchen Physically Abused:   . Sexually Abused:     Past Medical History, Surgical history, Social history, and Family history were reviewed and updated as appropriate.   Please see review of systems for further details on the patient's review from today.   Objective:   Physical Exam:  BP 118/63   Pulse 79   Wt 210 lb (95.3 kg)   BMI 26.96 kg/m   Physical Exam Constitutional:      General: He is not in acute distress. Musculoskeletal:        General: No deformity.  Neurological:     Mental Status: He is alert and oriented to person, place, and time.     Coordination: Coordination normal.  Psychiatric:        Attention and Perception: Attention and perception normal. He does not perceive auditory or visual hallucinations.         Mood and Affect: Mood normal. Mood is not anxious or depressed. Affect is not labile, blunt, angry or inappropriate.        Speech: Speech normal.        Behavior: Behavior normal.        Thought Content: Thought content normal. Thought content is not paranoid or delusional. Thought content does not include homicidal or suicidal ideation. Thought content does not include homicidal or suicidal plan.        Cognition and Memory: Cognition and memory normal.        Judgment: Judgment normal.     Comments: Insight intact  Lab Review:  No results found for: NA, K, CL, CO2, GLUCOSE, BUN, CREATININE, CALCIUM, PROT, ALBUMIN, AST, ALT, ALKPHOS, BILITOT, GFRNONAA, GFRAA  No results found for: WBC, RBC, HGB, HCT, PLT, MCV, MCH, MCHC, RDW, LYMPHSABS, MONOABS, EOSABS, BASOSABS  No results found for: POCLITH, LITHIUM   No results found for: PHENYTOIN, PHENOBARB, VALPROATE, CBMZ   .res Assessment: Plan:   Discussed option of continuing Viibryd to 20 mg po qd or increasing to 40 mg po qd. Pt reports that he would prefer to continue 20 mg dose at this time. Continue Adderall 15 mg po BID for ADD. Continue Xanax as needed for anxiety.  Continue Wellbutrin XL 150 mg po qd for depression. Continue Gabapentin prn RLS. Pt to f/u in 3 months or sooner if clinically indicated. Patient advised to contact office with any questions, adverse effects, or acute worsening in signs and symptoms.  Lennell was seen today for follow-up.  Diagnoses and all orders for this visit:  Depression, unspecified depression type -     Vilazodone HCl (VIIBRYD) 20 MG TABS; Take 1 tablet (20 mg total) by mouth daily.  Generalized anxiety disorder -     Vilazodone HCl (VIIBRYD) 20 MG TABS; Take 1 tablet (20 mg total) by mouth daily. -     ALPRAZolam (XANAX) 0.5 MG tablet; TAKE ONE TABLET BY MOUTH DAILY AS NEEDED FOR ANXIETY  Attention deficit hyperactivity disorder (ADHD), combined type -      amphetamine-dextroamphetamine (ADDERALL) 15 MG tablet; Take 1 tablet by mouth 2 (two) times daily. -     amphetamine-dextroamphetamine (ADDERALL) 15 MG tablet; Take 1 tablet by mouth 2 (two) times daily. -     amphetamine-dextroamphetamine (ADDERALL) 15 MG tablet; Take 1 tablet by mouth 2 (two) times daily.     Please see After Visit Summary for patient specific instructions.  Future Appointments  Date Time Provider Department Center  06/04/2020 11:00 AM Corie Chiquito, PMHNP [facility] None    No orders of the defined types were placed in this encounter.   -------------------------------

## 2020-03-21 ENCOUNTER — Other Ambulatory Visit: Payer: Self-pay | Admitting: Psychiatry
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
## 2020-06-04 ENCOUNTER — Ambulatory Visit: Payer: BC Managed Care – PPO | Admitting: Psychiatry

## 2020-06-26 ENCOUNTER — Other Ambulatory Visit: Payer: Self-pay | Admitting: Psychiatry

## 2020-06-26 DIAGNOSIS — F902 Attention-deficit hyperactivity disorder, combined type: Secondary | ICD-10-CM

## 2020-06-26 DIAGNOSIS — F411 Generalized anxiety disorder: Secondary | ICD-10-CM

## 2020-06-26 NOTE — Telephone Encounter (Signed)
Pt needs a RF of his Adderall RX sent to [facility] on [address]. He made appt for 8/10.

## 2020-06-27 ENCOUNTER — Other Ambulatory Visit: Payer: Self-pay

## 2020-06-27 DIAGNOSIS — F902 Attention-deficit hyperactivity disorder, combined type: Secondary | ICD-10-CM

## 2020-06-27 MED ORDER — AMPHETAMINE-DEXTROAMPHETAMINE 15 MG PO TABS: 15.0000 mg | ORAL_TABLET | Freq: Two times a day (BID) | ORAL | 0 refills | Status: DC

## 2020-07-23 ENCOUNTER — Ambulatory Visit: Payer: BC Managed Care – PPO | Admitting: Psychiatry

## 2020-08-15 ENCOUNTER — Ambulatory Visit (INDEPENDENT_AMBULATORY_CARE_PROVIDER_SITE_OTHER): Payer: BC Managed Care – PPO | Admitting: Psychiatry

## 2020-08-15 ENCOUNTER — Other Ambulatory Visit: Payer: Self-pay

## 2020-08-15 ENCOUNTER — Encounter: Payer: Self-pay | Admitting: Psychiatry

## 2020-08-15 DIAGNOSIS — G4700 Insomnia, unspecified: Secondary | ICD-10-CM

## 2020-08-15 DIAGNOSIS — G2581 Restless legs syndrome: Secondary | ICD-10-CM

## 2020-08-15 DIAGNOSIS — F902 Attention-deficit hyperactivity disorder, combined type: Secondary | ICD-10-CM

## 2020-08-15 DIAGNOSIS — F411 Generalized anxiety disorder: Secondary | ICD-10-CM | POA: Diagnosis not present

## 2020-08-15 MED ORDER — BUSPIRONE HCL 15 MG PO TABS: ORAL_TABLET | ORAL | 2 refills | Status: DC

## 2020-08-15 MED ORDER — GABAPENTIN 300 MG PO CAPS: 300.0000 mg | ORAL_CAPSULE | Freq: Every evening | ORAL | 2 refills | Status: DC | PRN

## 2020-08-15 MED ORDER — ALPRAZOLAM 1 MG PO TABS: 1.0000 mg | ORAL_TABLET | Freq: Every day | ORAL | 2 refills | Status: DC | PRN

## 2020-08-15 MED ORDER — AMPHETAMINE-DEXTROAMPHETAMINE 15 MG PO TABS: 15.0000 mg | ORAL_TABLET | Freq: Two times a day (BID) | ORAL | 0 refills | Status: DC

## 2020-08-15 MED ORDER — SILDENAFIL CITRATE 100 MG PO TABS
ORAL_TABLET | ORAL | 4 refills | Status: DC
Start: 2020-08-15 — End: 2021-01-02
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
## 2020-08-30 ENCOUNTER — Telehealth: Payer: Self-pay | Admitting: Psychiatry

## 2020-08-30 DIAGNOSIS — F32A Depression, unspecified: Secondary | ICD-10-CM

## 2020-08-30 DIAGNOSIS — F411 Generalized anxiety disorder: Secondary | ICD-10-CM

## 2020-08-30 MED ORDER — VIIBRYD 20 MG PO TABS: ORAL_TABLET | ORAL | 0 refills | Status: DC

## 2020-08-30 NOTE — Telephone Encounter (Signed)
I'm guessing he's asking to restart the Viibryd?

## 2020-08-30 NOTE — Telephone Encounter (Signed)
Script sent for [employer]

## 2020-08-30 NOTE — Telephone Encounter (Signed)
Pt lm stating he would like to reinstate his Viibryd Rx. If he need an appt to do this please give him a call.

## 2020-10-24 ENCOUNTER — Telehealth: Payer: Self-pay | Admitting: Psychiatry

## 2020-10-24 NOTE — Telephone Encounter (Signed)
Patient should have rx's on file. Will call pharmacy to verify

## 2020-10-24 NOTE — Telephone Encounter (Signed)
Patient's next visit is 11/14/20. Needs refills on Adderall and Xanax called to [facility] at [facility]. [phone]

## 2020-10-27 ENCOUNTER — Other Ambulatory Visit: Payer: Self-pay | Admitting: Psychiatry

## 2020-10-27 DIAGNOSIS — F411 Generalized anxiety disorder: Secondary | ICD-10-CM

## 2020-10-28 ENCOUNTER — Telehealth: Payer: Self-pay | Admitting: Psychiatry

## 2020-10-28 NOTE — Telephone Encounter (Signed)
Xanax pended for refill

## 2020-10-28 NOTE — Telephone Encounter (Signed)
Next apt 12/02 

## 2020-10-28 NOTE — Telephone Encounter (Signed)
Patient just called and [facility] still doesn't have the Xanax refill for him. Please call them to approve it. Thanks

## 2020-10-28 NOTE — Telephone Encounter (Signed)
Pended for Shanda Bumps to send for Nov. refill

## 2020-11-14 ENCOUNTER — Other Ambulatory Visit: Payer: Self-pay

## 2020-11-14 ENCOUNTER — Encounter: Payer: Self-pay | Admitting: Psychiatry

## 2020-11-14 ENCOUNTER — Ambulatory Visit (INDEPENDENT_AMBULATORY_CARE_PROVIDER_SITE_OTHER): Payer: BC Managed Care – PPO | Admitting: Psychiatry

## 2020-11-14 DIAGNOSIS — F411 Generalized anxiety disorder: Secondary | ICD-10-CM | POA: Diagnosis not present

## 2020-11-14 DIAGNOSIS — F902 Attention-deficit hyperactivity disorder, combined type: Secondary | ICD-10-CM

## 2020-11-14 DIAGNOSIS — G4700 Insomnia, unspecified: Secondary | ICD-10-CM | POA: Diagnosis not present

## 2020-11-14 DIAGNOSIS — F32A Depression, unspecified: Secondary | ICD-10-CM

## 2020-11-14 DIAGNOSIS — G2581 Restless legs syndrome: Secondary | ICD-10-CM

## 2020-11-14 MED ORDER — BUSPIRONE HCL 15 MG PO TABS: 15.0000 mg | ORAL_TABLET | Freq: Two times a day (BID) | ORAL | 0 refills | Status: DC

## 2020-11-14 MED ORDER — VIIBRYD 40 MG PO TABS: 40.0000 mg | ORAL_TABLET | Freq: Every day | ORAL | 1 refills | Status: DC

## 2020-11-14 MED ORDER — ALPRAZOLAM 1 MG PO TABS: ORAL_TABLET | ORAL | 2 refills | Status: DC

## 2020-11-14 MED ORDER — GABAPENTIN 300 MG PO CAPS: 300.0000 mg | ORAL_CAPSULE | Freq: Every evening | ORAL | 2 refills | Status: DC | PRN

## 2020-11-14 MED ORDER — AMPHETAMINE-DEXTROAMPHETAMINE 15 MG PO TABS: 15.0000 mg | ORAL_TABLET | Freq: Two times a day (BID) | ORAL | 0 refills | Status: DC

## 2020-11-14 NOTE — Progress Notes (Signed)
Aaron West [id number] 10/05/1976 44 y.o.  Subjective:   Patient ID:  Aaron West is a 44 y.o. (DOB 1976-03-14) male.  Chief Complaint:  Chief Complaint  Patient presents with  . Anxiety  . Follow-up    Depression, ADD    HPI Aaron West presents to the office today for follow-up of anxiety, mood disturbance, ADHD, and insomnia.  He reports that girlfriend of 6-8 months ended relationship and this has been difficult for him and was not expected. Had to have emergency appendectomy.   He reports that he has not been able to look forward to things recently. He reports that he has been having some "anxiety attacks" and will have catastrophic thoughts. He reports that he dwell on things, such as what he would do if something happened to his mother. He reports some avoidance in response to anxiety. He reports that he will play through different scenarios in his mind, such as why relationship ended. He reports that he has been trying to use Xanax prn only so that it will remain effective.   He reports that Adderall is helpful with concentration and feels less anxious when he takes it. He reports that he has had difficulty with sleep. Energy and motivation have been "decent." Weight has been stable. Denies SI.   He reports that he feels best when he is drinking. He reports that he is not drinking daily and will drink excessively at times. Denies any impulsive or risky behavior except when drinking. Reports drinking heavily for several consecutive days after relationship ended.   Continues to work with job that he has had. He reports that he has had increased work demands.   Has apt to start therapy tomorrow with a counselor that has seen his daughter in the past.   Daughter was in detox and has been estranged for the past 2 months. Does not have driving privileges yet.   Past Psychiatric Medication Trials: Wellbutrin- Feels that 150 mg po qd has been helpful for irritability. Cannot  tolerate 300 mg.  Sertraline- Has been helpful. Questioned if it caused wt. Gain. May have caused some sleepiness. Viibryd Prozac- Had adverse effects Doxepin Trazodone Gabapentin- helpful for RLS May have taken Lexapro   Review of Systems:  Review of Systems  Cardiovascular: Negative for palpitations.  Gastrointestinal: Positive for abdominal pain.       Recent appendectomy  Musculoskeletal: Negative for gait problem.  Neurological: Negative for tremors.  Psychiatric/Behavioral:       Please refer to HPI    Medications: I have reviewed the patient's current medications.  Current Outpatient Medications  Medication Sig Dispense Refill  . [START ON 11/26/2020] ALPRAZolam (XANAX) 1 MG tablet TAKE ONE TABLET BY MOUTH DAILY AS NEEDED FOR ANXIETY 15 tablet 2  . [START ON 01/19/2021] amphetamine-dextroamphetamine (ADDERALL) 15 MG tablet Take 1 tablet by mouth 2 (two) times daily. 60 tablet 0  . busPIRone (BUSPAR) 15 MG tablet Take 1 tablet (15 mg total) by mouth 2 (two) times daily. 180 tablet 0  . Multiple Vitamin (MULTIVITAMIN) tablet Take 1 tablet by mouth daily.    . sildenafil (VIAGRA) 100 MG tablet TAKE ONE-HALF TABLET BY MOUTH DAILY AS NEEDED FOR ERECTILE DYSFUNCTION 10 tablet 4  . [START ON 12/22/2020] amphetamine-dextroamphetamine (ADDERALL) 15 MG tablet Take 1 tablet by mouth 2 (two) times daily. 60 tablet 0  . [START ON 11/24/2020] amphetamine-dextroamphetamine (ADDERALL) 15 MG tablet Take 1 tablet by mouth 2 (two) times daily. 60 tablet 0  . gabapentin (  NEURONTIN) 300 MG capsule Take 1 capsule (300 mg total) by mouth at bedtime as needed (RLS). 30 capsule 2  . Vilazodone HCl (VIIBRYD) 40 MG TABS Take 1 tablet (40 mg total) by mouth daily. 90 tablet 1   No current facility-administered medications for this visit.    Medication Side Effects: None  Allergies:  Allergies  Allergen Reactions  . Penicillins   . Trazodone And Nefazodone     History reviewed. No pertinent past  medical history.  Family History  Problem Relation Age of Onset  . ADD / ADHD Brother   . Anxiety disorder Brother   . Mental illness Maternal Uncle   . Mental illness Cousin   . ADD / ADHD Daughter   . Depression Daughter   . Drug abuse Daughter   . Depression Daughter     Social History   Socioeconomic History  . Marital status: Married    Spouse name: Not on file  . Number of children: Not on file  . Years of education: Not on file  . Highest education level: Not on file  Occupational History  . Not on file  Tobacco Use  . Smoking status: Former Games developer  . Smokeless tobacco: Never Used  Substance and Sexual Activity  . Alcohol use: Yes  . Drug use: Yes    Types: Marijuana  . Sexual activity: Not on file  Other Topics Concern  . Not on file  Social History Narrative  . Not on file   Social Determinants of Health   Financial Resource Strain:   . Difficulty of Paying Living Expenses: Not on file  Food Insecurity:   . Worried About Programme researcher, broadcasting/film/video in the Last Year: Not on file  . Ran Out of Food in the Last Year: Not on file  Transportation Needs:   . Lack of Transportation (Medical): Not on file  . Lack of Transportation (Non-Medical): Not on file  Physical Activity:   . Days of Exercise per Week: Not on file  . Minutes of Exercise per Session: Not on file  Stress:   . Feeling of Stress : Not on file  Social Connections:   . Frequency of Communication with Friends and Family: Not on file  . Frequency of Social Gatherings with Friends and Family: Not on file  . Attends Religious Services: Not on file  . Active Member of Clubs or Organizations: Not on file  . Attends Banker Meetings: Not on file  . Marital Status: Not on file  Intimate Partner Violence:   . Fear of Current or Ex-Partner: Not on file  . Emotionally Abused: Not on file  . Physically Abused: Not on file  . Sexually Abused: Not on file    Past Medical History, Surgical  history, Social history, and Family history were reviewed and updated as appropriate.   Please see review of systems for further details on the patient's review from today.   Objective:   Physical Exam:  There were no vitals taken for this visit.  Physical Exam Constitutional:      General: He is not in acute distress. Musculoskeletal:        General: No deformity.  Neurological:     Mental Status: He is alert and oriented to person, place, and time.     Coordination: Coordination normal.  Psychiatric:        Attention and Perception: Attention and perception normal. He does not perceive auditory or visual hallucinations.  Mood and Affect: Mood is anxious and depressed. Affect is not labile, blunt, angry or inappropriate.        Speech: Speech is rapid and pressured.        Behavior: Behavior normal.        Thought Content: Thought content normal. Thought content is not paranoid or delusional. Thought content does not include homicidal or suicidal ideation. Thought content does not include homicidal or suicidal plan.        Cognition and Memory: Cognition and memory normal.        Judgment: Judgment normal.     Comments: Insight intact     Lab Review:  No results found for: NA, K, CL, CO2, GLUCOSE, BUN, CREATININE, CALCIUM, PROT, ALBUMIN, AST, ALT, ALKPHOS, BILITOT, GFRNONAA, GFRAA  No results found for: WBC, RBC, HGB, HCT, PLT, MCV, MCH, MCHC, RDW, LYMPHSABS, MONOABS, EOSABS, BASOSABS  No results found for: POCLITH, LITHIUM   No results found for: PHENYTOIN, PHENOBARB, VALPROATE, CBMZ   .res Assessment: Plan:   Discussed potential benefits, risks, and side effects of increasing Viibryd to 40 mg po qd for depression and anxiety. Pt agrees to increase in Viibryd to 40 mg po qd.  Discussed re-starting Gabapentin to improve insomnia. Continue Buspar 15 mg po BID for anxiety.  Continue Adderall 15 mg po BID for ADHD.  Continue Xanax prn anxiety.  Agree with plan to  re-start therapy.  Pt to follow-up in 6-8 weeks or sooner if clinically indicated.  Patient advised to contact office with any questions, adverse effects, or acute worsening in signs and symptoms.   Aaron West was seen today for anxiety and follow-up.  Diagnoses and all orders for this visit:  Generalized anxiety disorder -     Vilazodone HCl (VIIBRYD) 40 MG TABS; Take 1 tablet (40 mg total) by mouth daily. -     ALPRAZolam (XANAX) 1 MG tablet; TAKE ONE TABLET BY MOUTH DAILY AS NEEDED FOR ANXIETY -     busPIRone (BUSPAR) 15 MG tablet; Take 1 tablet (15 mg total) by mouth 2 (two) times daily. -     gabapentin (NEURONTIN) 300 MG capsule; Take 1 capsule (300 mg total) by mouth at bedtime as needed (RLS).  Depression, unspecified depression type -     Vilazodone HCl (VIIBRYD) 40 MG TABS; Take 1 tablet (40 mg total) by mouth daily.  Attention deficit hyperactivity disorder (ADHD), combined type -     amphetamine-dextroamphetamine (ADDERALL) 15 MG tablet; Take 1 tablet by mouth 2 (two) times daily. -     amphetamine-dextroamphetamine (ADDERALL) 15 MG tablet; Take 1 tablet by mouth 2 (two) times daily. -     amphetamine-dextroamphetamine (ADDERALL) 15 MG tablet; Take 1 tablet by mouth 2 (two) times daily.  Insomnia, unspecified type -     gabapentin (NEURONTIN) 300 MG capsule; Take 1 capsule (300 mg total) by mouth at bedtime as needed (RLS).  Restless legs syndrome (RLS) -     gabapentin (NEURONTIN) 300 MG capsule; Take 1 capsule (300 mg total) by mouth at bedtime as needed (RLS).     Please see After Visit Summary for patient specific instructions.  Future Appointments  Date Time Provider Department Center  01/02/2021  8:30 AM Corie Chiquito, PMHNP [facility] None    No orders of the defined types were placed in this encounter.   -------------------------------

## 2021-01-02 ENCOUNTER — Encounter: Payer: Self-pay | Admitting: Psychiatry

## 2021-01-02 ENCOUNTER — Telehealth: Payer: Self-pay | Admitting: Psychiatry

## 2021-01-02 ENCOUNTER — Telehealth (INDEPENDENT_AMBULATORY_CARE_PROVIDER_SITE_OTHER): Payer: BC Managed Care – PPO | Admitting: Psychiatry

## 2021-01-02 DIAGNOSIS — F902 Attention-deficit hyperactivity disorder, combined type: Secondary | ICD-10-CM

## 2021-01-02 DIAGNOSIS — F411 Generalized anxiety disorder: Secondary | ICD-10-CM

## 2021-01-02 DIAGNOSIS — F32A Depression, unspecified: Secondary | ICD-10-CM

## 2021-01-02 MED ORDER — BUSPIRONE HCL 15 MG PO TABS: ORAL_TABLET | ORAL | 0 refills | Status: DC

## 2021-01-02 MED ORDER — BUPROPION HCL ER (XL) 150 MG PO TB24: 150.0000 mg | ORAL_TABLET | Freq: Every day | ORAL | 2 refills | Status: DC

## 2021-01-02 MED ORDER — AMPHETAMINE-DEXTROAMPHETAMINE 15 MG PO TABS: 15.0000 mg | ORAL_TABLET | Freq: Two times a day (BID) | ORAL | 0 refills | Status: DC

## 2021-01-02 MED ORDER — ALPRAZOLAM 1 MG PO TABS: ORAL_TABLET | ORAL | 0 refills | Status: DC

## 2021-01-02 MED ORDER — SILDENAFIL CITRATE 100 MG PO TABS
ORAL_TABLET | ORAL | 4 refills | Status: DC
Start: 2021-01-02 — End: 2021-07-15

## 2021-01-02 NOTE — Progress Notes (Signed)
Aaron West [id number] [birth year] 45 y.o.  Virtual Visit via Video Note  I connected with pt @ on 01/02/21 at 10:30 AM EST by a video enabled telemedicine application and verified that I am speaking with the correct person using two identifiers.   I discussed the limitations of evaluation and management by telemedicine and the availability of in person appointments. The patient expressed understanding and agreed to proceed.  I discussed the assessment and treatment plan with the patient. The patient was provided an opportunity to ask questions and all were answered. The patient agreed with the plan and demonstrated an understanding of the instructions.   The patient was advised to call back or seek an in-person evaluation if the symptoms worsen or if the condition fails to improve as anticipated.  I provided 30 minutes of non-face-to-face time during this encounter.  The patient was located at home.  The provider was located at [facility].   Corie Chiquito, PMHNP   Subjective:   Patient ID:  Aaron West is a 45 y.o. (DOB 12/13/1976) male.  Chief Complaint:  Chief Complaint  Patient presents with   Depression   Anxiety   Insomnia    HPI Aaron West presents for follow-up of depression, anxiety, ADHD, and insomnia. He reports that he has been having multiple stressors. He reports that he is "trying to stay positive and do my part." He reports that he has not seen any differences, positive or negative, with increase in Viibryd. He reports that he has been feeling tired often. He reports that his appetite has been increased at times and other days appetite is ok. He reports that appetite has been up and down. He reports that he has had periods of "overwhelming anxiety that's situational." He reports that he takes Xanax in the morning with some improvement in anxiety. He reports that depression has been "decent." He notices some mild irritability and frustration and  attributes this to factors, such as not being able to drive for the last 6 months and having to arrange transportation. Concentration is difficult when he does not have Adderall. He reports that sleep does not feel restful. Has been taking Gabapentin at night some nights. Denies anhedonia. Denies SI.   He reports that he forgets to take Buspar at night.   Has not been able to get into a regular exercise routine. He and his girlfriend remain apart. 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  Has been talking to a therapist regularly.   He reports occ ETOH use. Drank last Friday and Saturday nights. He reports that he has been mindful of his ETOH use.   Past Psychiatric Medication Trials: Wellbutrin- Feels that 150 mg po qd has been helpful for irritability. Cannot tolerate 300 mg.  Sertraline- Has been helpful. Questioned if it caused wt. Gain. May have caused some sleepiness. Viibryd Prozac- Had adverse effects Doxepin Trazodone Gabapentin- helpful for RLS May have taken Lexapro  Review of Systems:  Review of Systems  Gastrointestinal: Negative.        Occ abdominal pain after emergency appendectomy  Musculoskeletal: Negative for gait problem.  Neurological: Negative for tremors and headaches.  Psychiatric/Behavioral:       Please refer to HPI   Denies any RLS  Medications: I have reviewed the patient's current medications.  Current Outpatient Medications  Medication Sig Dispense Refill   buPROPion (WELLBUTRIN XL) 150 MG 24 hr tablet Take 1 tablet (150  mg total) by mouth daily. 30 tablet 2   ALPRAZolam (XANAX) 1 MG tablet TAKE ONE TABLET BY MOUTH DAILY AS NEEDED FOR ANXIETY 30 tablet 0   amphetamine-dextroamphetamine (ADDERALL) 15 MG tablet Take 1 tablet by mouth 2 (two) times daily. 60 tablet 0   [START ON 01/19/2021] amphetamine-dextroamphetamine (ADDERALL) 15 MG tablet Take 1 tablet by mouth 2 (two) times daily. 60 tablet  0   [START ON 01/30/2021] amphetamine-dextroamphetamine (ADDERALL) 15 MG tablet Take 1 tablet by mouth 2 (two) times daily. 60 tablet 0   busPIRone (BUSPAR) 15 MG tablet Decrease to 1/2 tablet daily for 5 days, then stop. 180 tablet 0   gabapentin (NEURONTIN) 300 MG capsule Take 1 capsule (300 mg total) by mouth at bedtime as needed (RLS). 30 capsule 2   Multiple Vitamin (MULTIVITAMIN) tablet Take 1 tablet by mouth daily.     sildenafil (VIAGRA) 100 MG tablet TAKE ONE-HALF TABLET BY MOUTH DAILY AS NEEDED FOR ERECTILE DYSFUNCTION 10 tablet 4   Vilazodone HCl (VIIBRYD) 40 MG TABS Take 1 tablet (40 mg total) by mouth daily. 90 tablet 1   No current facility-administered medications for this visit.    Medication Side Effects: None  Allergies:  Allergies  Allergen Reactions   Penicillins    Trazodone And Nefazodone     History reviewed. No pertinent past medical history.  Family History  Problem Relation Age of Onset   ADD / ADHD Brother    Anxiety disorder Brother    Mental illness Maternal Uncle    Mental illness Cousin    ADD / ADHD Daughter    Depression Daughter    Drug abuse Daughter    Depression Daughter     Social History   Socioeconomic History   Marital status: Married    Spouse name: Not on file   Number of children: Not on file   Years of education: Not on file   Highest education level: Not on file  Occupational History   Not on file  Tobacco Use   Smoking status: Former Smoker   Smokeless tobacco: Never Used  Substance and Sexual Activity   Alcohol use: Yes   Drug use: Yes    Types: Marijuana   Sexual activity: Not on file  Other Topics Concern   Not on file  Social History Narrative   Not on file   Social Determinants of Health   Financial Resource Strain: Not on file  Food Insecurity: Not on file  Transportation Needs: Not on file  Physical Activity: Not on file  Stress: Not on file  Social Connections: Not on file   Intimate Partner Violence: Not on file    Past Medical History, Surgical history, Social history, and Family history were reviewed and updated as appropriate.   Please see review of systems for further details on the patient's review from today.   Objective:   Physical Exam:  There were no vitals taken for this visit.  Physical Exam Neurological:     Mental Status: He is alert and oriented to person, place, and time.     Cranial Nerves: No dysarthria.  Psychiatric:        Attention and Perception: Attention and perception normal.        Mood and Affect: Mood is anxious and depressed.        Speech: Speech normal.        Behavior: Behavior is cooperative.        Thought Content: Thought content normal. Thought  content is not paranoid or delusional. Thought content does not include homicidal or suicidal ideation. Thought content does not include homicidal or suicidal plan.        Cognition and Memory: Cognition and memory normal.        Judgment: Judgment normal.     Comments: Insight intact     Lab Review:  No results found for: NA, K, CL, CO2, GLUCOSE, BUN, CREATININE, CALCIUM, PROT, ALBUMIN, AST, ALT, ALKPHOS, BILITOT, GFRNONAA, GFRAA  No results found for: WBC, RBC, HGB, HCT, PLT, MCV, MCH, MCHC, RDW, LYMPHSABS, MONOABS, EOSABS, BASOSABS  No results found for: POCLITH, LITHIUM   No results found for: PHENYTOIN, PHENOBARB, VALPROATE, CBMZ   .res Assessment: Plan:   Discussed potential benefits, risks, and side effects of Wellbutrin XL . Pt agrees to re-start of Wellbutrin XL 150 mg po qd to improve mood.  Will decrease Buspar to 1/2 tablet daily for 5 days, then stop.  Continue Adderall 15 mg po BID for ADHD.  Will increase qty of Xanax to #30 for one month and then resume #15. Continue Viibryd 40 mg po qd for depression and anxiety.  Continue Gabapentin prn anxiety and RLS. Pt to follow-up in 1-2 months or sooner if clinically indicated.  Recommend continuing  therapy.  Patient advised to contact office with any questions, adverse effects, or acute worsening in signs and symptoms.   Aaron West was seen today for depression, anxiety and insomnia.  Diagnoses and all orders for this visit:  Depression, unspecified depression type -     buPROPion (WELLBUTRIN XL) 150 MG 24 hr tablet; Take 1 tablet (150 mg total) by mouth daily.  Generalized anxiety disorder -     busPIRone (BUSPAR) 15 MG tablet; Decrease to 1/2 tablet daily for 5 days, then stop. -     ALPRAZolam (XANAX) 1 MG tablet; TAKE ONE TABLET BY MOUTH DAILY AS NEEDED FOR ANXIETY  Attention deficit hyperactivity disorder (ADHD), combined type -     amphetamine-dextroamphetamine (ADDERALL) 15 MG tablet; Take 1 tablet by mouth 2 (two) times daily.  Other orders -     sildenafil (VIAGRA) 100 MG tablet; TAKE ONE-HALF TABLET BY MOUTH DAILY AS NEEDED FOR ERECTILE DYSFUNCTION     Please see After Visit Summary for patient specific instructions.  No future appointments.  No orders of the defined types were placed in this encounter.     -------------------------------

## 2021-01-02 NOTE — Telephone Encounter (Signed)
Mr. Aaron West, Aaron West are scheduled for a virtual visit with your provider today.    Just as we do with appointments in the office, we must obtain your consent to participate.  Your consent will be active for this visit and any virtual visit you may have with one of our providers in the next 365 days.    If you have a MyChart account, I can also send a copy of this consent to you electronically.  All virtual visits are billed to your insurance company just like a traditional visit in the office.  As this is a virtual visit, video technology does not allow for your provider to perform a traditional examination.  This may limit your provider's ability to fully assess your condition.  If your provider identifies any concerns that need to be evaluated in person or the need to arrange testing such as labs, EKG, etc, we will make arrangements to do so.    Although advances in technology are sophisticated, we cannot ensure that it will always work on either your end or our end.  If the connection with a video visit is poor, we may have to switch to a telephone visit.  With either a video or telephone visit, we are not always able to ensure that we have a secure connection.   I need to obtain your verbal consent now.   Are you willing to proceed with your visit today?   Aaron West has provided verbal consent on 01/02/2021 for a virtual visit (video or telephone).   Corie Chiquito, PMHNP 01/02/2021  10:51 AM

## 2021-02-13 ENCOUNTER — Other Ambulatory Visit: Payer: Self-pay | Admitting: Psychiatry

## 2021-02-13 DIAGNOSIS — F411 Generalized anxiety disorder: Secondary | ICD-10-CM

## 2021-04-07 ENCOUNTER — Other Ambulatory Visit: Payer: Self-pay | Admitting: Psychiatry

## 2021-04-07 DIAGNOSIS — F32A Depression, unspecified: Secondary | ICD-10-CM

## 2021-04-07 NOTE — Telephone Encounter (Signed)
Pt also needs a refill on his adderall 15 mg as well. He has an appt in june

## 2021-04-07 NOTE — Telephone Encounter (Signed)
Adderall pended. 

## 2021-04-08 NOTE — Telephone Encounter (Signed)
The way I was shown to pend was to start a refill request and route the call to you,Ill have to get traci to show me what I'm doing wrong

## 2021-04-09 ENCOUNTER — Other Ambulatory Visit: Payer: Self-pay

## 2021-04-09 ENCOUNTER — Telehealth: Payer: Self-pay | Admitting: Psychiatry

## 2021-04-09 DIAGNOSIS — F902 Attention-deficit hyperactivity disorder, combined type: Secondary | ICD-10-CM

## 2021-04-09 MED ORDER — AMPHETAMINE-DEXTROAMPHETAMINE 15 MG PO TABS: 15.0000 mg | ORAL_TABLET | Freq: Two times a day (BID) | ORAL | 0 refills | Status: DC

## 2021-04-09 NOTE — Telephone Encounter (Signed)
Pt has called on Monday and Tuesday and again today. He needs a refill on his adderall  15 mg to be sent to the [facility] on [address]. He is completely out

## 2021-04-09 NOTE — Telephone Encounter (Signed)
pended

## 2021-05-07 ENCOUNTER — Telehealth: Payer: Self-pay | Admitting: Psychiatry

## 2021-05-07 NOTE — Telephone Encounter (Signed)
Next visit is 05/23/21. Requesting refill on Adderall called to:  [facility] [address], [place] - [address] Phone:  [phone]  Fax:  [phone]

## 2021-05-08 ENCOUNTER — Other Ambulatory Visit: Payer: Self-pay

## 2021-05-08 DIAGNOSIS — F902 Attention-deficit hyperactivity disorder, combined type: Secondary | ICD-10-CM

## 2021-05-08 MED ORDER — AMPHETAMINE-DEXTROAMPHETAMINE 15 MG PO TABS: 15.0000 mg | ORAL_TABLET | Freq: Two times a day (BID) | ORAL | 0 refills | Status: DC

## 2021-05-08 NOTE — Telephone Encounter (Signed)
Yes no problem

## 2021-05-08 NOTE — Telephone Encounter (Signed)
04/09/21

## 2021-05-08 NOTE — Telephone Encounter (Signed)
Last filled 4/27  appt 05/23/21 pended

## 2021-05-19 ENCOUNTER — Other Ambulatory Visit: Payer: Self-pay | Admitting: Psychiatry

## 2021-05-19 DIAGNOSIS — F411 Generalized anxiety disorder: Secondary | ICD-10-CM

## 2021-05-19 NOTE — Telephone Encounter (Signed)
Last filled 04/08/21

## 2021-05-23 ENCOUNTER — Ambulatory Visit: Payer: BC Managed Care – PPO | Admitting: Psychiatry

## 2021-06-03 ENCOUNTER — Other Ambulatory Visit: Payer: Self-pay | Admitting: Psychiatry

## 2021-06-03 DIAGNOSIS — F32A Depression, unspecified: Secondary | ICD-10-CM

## 2021-06-03 DIAGNOSIS — F411 Generalized anxiety disorder: Secondary | ICD-10-CM

## 2021-06-06 NOTE — Telephone Encounter (Signed)
No show 6/10, not rescheduled

## 2021-06-13 ENCOUNTER — Other Ambulatory Visit: Payer: Self-pay | Admitting: Psychiatry

## 2021-06-13 DIAGNOSIS — F32A Depression, unspecified: Secondary | ICD-10-CM

## 2021-06-13 NOTE — Telephone Encounter (Signed)
Pt called and said that he also needs a refill on his adderall as well

## 2021-06-17 ENCOUNTER — Other Ambulatory Visit: Payer: Self-pay

## 2021-06-17 ENCOUNTER — Telehealth: Payer: Self-pay | Admitting: Psychiatry

## 2021-06-17 DIAGNOSIS — F902 Attention-deficit hyperactivity disorder, combined type: Secondary | ICD-10-CM

## 2021-06-17 MED ORDER — AMPHETAMINE-DEXTROAMPHETAMINE 15 MG PO TABS: 15.0000 mg | ORAL_TABLET | Freq: Two times a day (BID) | ORAL | 0 refills | Status: DC

## 2021-06-17 NOTE — Telephone Encounter (Signed)
Pended.

## 2021-06-17 NOTE — Telephone Encounter (Signed)
Pt called requesting refill for Wellbutrin & Adderall @ [facility] [address]. Stated he called Friday about refills.

## 2021-06-19 ENCOUNTER — Other Ambulatory Visit: Payer: Self-pay | Admitting: Psychiatry

## 2021-06-19 DIAGNOSIS — F411 Generalized anxiety disorder: Secondary | ICD-10-CM

## 2021-06-19 NOTE — Telephone Encounter (Signed)
Last filled 6/7 appt on 8/2

## 2021-07-15 ENCOUNTER — Telehealth (INDEPENDENT_AMBULATORY_CARE_PROVIDER_SITE_OTHER): Payer: BC Managed Care – PPO | Admitting: Psychiatry

## 2021-07-15 ENCOUNTER — Encounter: Payer: Self-pay | Admitting: Psychiatry

## 2021-07-15 DIAGNOSIS — F411 Generalized anxiety disorder: Secondary | ICD-10-CM

## 2021-07-15 DIAGNOSIS — G2581 Restless legs syndrome: Secondary | ICD-10-CM

## 2021-07-15 DIAGNOSIS — F32A Depression, unspecified: Secondary | ICD-10-CM

## 2021-07-15 DIAGNOSIS — F902 Attention-deficit hyperactivity disorder, combined type: Secondary | ICD-10-CM

## 2021-07-15 DIAGNOSIS — G4700 Insomnia, unspecified: Secondary | ICD-10-CM

## 2021-07-15 MED ORDER — GABAPENTIN 300 MG PO CAPS: 300.0000 mg | ORAL_CAPSULE | Freq: Every evening | ORAL | 2 refills | Status: DC | PRN

## 2021-07-15 MED ORDER — SILDENAFIL CITRATE 100 MG PO TABS: ORAL_TABLET | ORAL | 4 refills | Status: DC

## 2021-07-15 MED ORDER — AMPHETAMINE-DEXTROAMPHETAMINE 15 MG PO TABS: 15.0000 mg | ORAL_TABLET | Freq: Two times a day (BID) | ORAL | 0 refills | Status: DC

## 2021-07-15 MED ORDER — ALPRAZOLAM 1 MG PO TABS: ORAL_TABLET | ORAL | 2 refills | Status: DC

## 2021-07-15 MED ORDER — BUPROPION HCL ER (XL) 150 MG PO TB24: 150.0000 mg | ORAL_TABLET | Freq: Every day | ORAL | 1 refills | Status: DC

## 2021-07-15 NOTE — Progress Notes (Signed)
Aaron West [id number] 1976/11/01 45 y.o.  Virtual Visit via Video Note  I connected with pt @ on 07/15/21 at  9:30 AM EDT by a video enabled telemedicine application and verified that I am speaking with the correct person using two identifiers.   I discussed the limitations of evaluation and management by telemedicine and the availability of in person appointments. The patient expressed understanding and agreed to proceed.  I discussed the assessment and treatment plan with the patient. The patient was provided an opportunity to ask questions and all were answered. The patient agreed with the plan and demonstrated an understanding of the instructions.   The patient was advised to call back or seek an in-person evaluation if the symptoms worsen or if the condition fails to improve as anticipated.  I provided 30 minutes of non-face-to-face time during this encounter.  The patient was located at home.  The provider was located at [facility].   Corie Chiquito, PMHNP   Subjective:   Patient ID:  Aaron West is a 45 y.o. (DOB September 02, 1976) male.  Chief Complaint:  Chief Complaint  Patient presents with   Follow-up    Anxiety, depression, ADHD, and insomnia     HPI Aaron West presents for follow-up of anxiety, depression, ADHD, and insomnia. "I feel like I have been doing pretty good." He reports that he stopped Viibryd after running out of it and questions if it caused drowsiness. He reports that he has been feeling ok without it. Concentration has been adequate with Adderall. Denies depressed mood. He reports that anxiety has been ok with some situational stress with work. Sleeping ok and that sleep is improved compared to the past. Appetite has been good. He reports that he has been trying to drink less and is currently drinking on the weekends. Energy and motivation have been ok. Denies SI.   He reports that he uses Gabapentin prn for RLS.   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 He will be getting license back in a few weeks. He has to serve additional time in jail and has completed community service.   Past Psychiatric Medication Trials: Wellbutrin- Feels that 150 mg po qd has been helpful for irritability. Cannot tolerate 300 mg. Sertraline- Has been helpful. Questioned if it caused wt. Gain. May have caused some sleepiness. Viibryd Prozac- Had adverse effects Doxepin Trazodone Gabapentin- helpful for RLS May have taken Lexapro  Review of Systems:  Review of Systems  Musculoskeletal:  Negative for gait problem.  Neurological:  Negative for tremors and headaches.  Psychiatric/Behavioral:         Please refer to HPI   Medications: I have reviewed the patient's current medications.  Current Outpatient Medications  Medication Sig Dispense Refill   Multiple Vitamin (MULTIVITAMIN) tablet Take 1 tablet by mouth daily.     [START ON 07/18/2021] ALPRAZolam (XANAX) 1 MG tablet TAKE ONE TABLET BY MOUTH DAILY AS NEEDED FOR ANXIETY 30 tablet 2   [START ON 09/10/2021] amphetamine-dextroamphetamine (ADDERALL) 15 MG tablet Take 1 tablet by mouth 2 (two) times daily. 60 tablet 0   [START ON 08/13/2021] amphetamine-dextroamphetamine (ADDERALL) 15 MG tablet Take 1 tablet by mouth 2 (two) times daily. 60 tablet 0   amphetamine-dextroamphetamine (ADDERALL) 15 MG tablet Take 1 tablet by mouth 2 (two) times daily. 60 tablet 0   buPROPion (WELLBUTRIN XL) 150 MG 24 hr tablet Take 1 tablet (150 mg total) by mouth daily. 90 tablet 1   gabapentin (  NEURONTIN) 300 MG capsule Take 1 capsule (300 mg total) by mouth at bedtime as needed (RLS). 30 capsule 2   sildenafil (VIAGRA) 100 MG tablet TAKE ONE-HALF TABLET BY MOUTH DAILY AS NEEDED FOR ERECTILE DYSFUNCTION 10 tablet 4   No current facility-administered medications for this visit.    Medication Side Effects: None  Allergies:  Allergies  Allergen Reactions   Penicillins    Trazodone And  Nefazodone     History reviewed. No pertinent past medical history.  Family History  Problem Relation Age of Onset   ADD / ADHD Brother    Anxiety disorder Brother    Mental illness Maternal Uncle    Mental illness Cousin    ADD / ADHD Daughter    Depression Daughter    Drug abuse Daughter    Depression Daughter     Social History   Socioeconomic History   Marital status: Married    Spouse name: Not on file   Number of children: Not on file   Years of education: Not on file   Highest education level: Not on file  Occupational History   Not on file  Tobacco Use   Smoking status: Former   Smokeless tobacco: Never  Substance and Sexual Activity   Alcohol use: Yes   Drug use: Yes    Types: Marijuana   Sexual activity: Not on file  Other Topics Concern   Not on file  Social History Narrative   Not on file   Social Determinants of Health   Financial Resource Strain: Not on file  Food Insecurity: Not on file  Transportation Needs: Not on file  Physical Activity: Not on file  Stress: Not on file  Social Connections: Not on file  Intimate Partner Violence: Not on file    Past Medical History, Surgical history, Social history, and Family history were reviewed and updated as appropriate.   Please see review of systems for further details on the patient's review from today.   Objective:   Physical Exam:  BP 130/87   Pulse 74   Physical Exam Neurological:     Mental Status: He is alert and oriented to person, place, and time.     Cranial Nerves: No dysarthria.  Psychiatric:        Attention and Perception: Attention and perception normal.        Mood and Affect: Mood normal.        Speech: Speech normal.        Behavior: Behavior is cooperative.        Thought Content: Thought content normal. Thought content is not paranoid or delusional. Thought content does not include homicidal or suicidal ideation. Thought content does not include homicidal or suicidal  plan.        Cognition and Memory: Cognition and memory normal.        Judgment: Judgment normal.     Comments: Insight intact    Lab Review:  No results found for: NA, K, CL, CO2, GLUCOSE, BUN, CREATININE, CALCIUM, PROT, ALBUMIN, AST, ALT, ALKPHOS, BILITOT, GFRNONAA, GFRAA  No results found for: WBC, RBC, HGB, HCT, PLT, MCV, MCH, MCHC, RDW, LYMPHSABS, MONOABS, EOSABS, BASOSABS  No results found for: POCLITH, LITHIUM   No results found for: PHENYTOIN, PHENOBARB, VALPROATE, CBMZ   .res Assessment: Plan:   Pt seen for 30 minutes and time spent counseling pt regarding treatment plan. He reports that he would like to stop drinking and declines any treatment for ETOH use and reports  that he prefers to stop ETOH use on his own.  Continue Adderall 15 mg po BID for ADHD. Continue Wellbutrin XL 150 mg po qd for depression.  Continue Xanax 1 mg po qd prn anxiety.  Continue Gabapentin 300 mg po QHS prn RLS and insomnia.  Pt to follow-up in 3 months or sooner if clinically indicated.  Patient advised to contact office with any questions, adverse effects, or acute worsening in signs and symptoms.   Aaron West was seen today for follow-up.  Diagnoses and all orders for this visit:  Generalized anxiety disorder -     ALPRAZolam (XANAX) 1 MG tablet; TAKE ONE TABLET BY MOUTH DAILY AS NEEDED FOR ANXIETY -     gabapentin (NEURONTIN) 300 MG capsule; Take 1 capsule (300 mg total) by mouth at bedtime as needed (RLS).  Attention deficit hyperactivity disorder (ADHD), combined type -     amphetamine-dextroamphetamine (ADDERALL) 15 MG tablet; Take 1 tablet by mouth 2 (two) times daily. -     amphetamine-dextroamphetamine (ADDERALL) 15 MG tablet; Take 1 tablet by mouth 2 (two) times daily. -     amphetamine-dextroamphetamine (ADDERALL) 15 MG tablet; Take 1 tablet by mouth 2 (two) times daily.  Depression, unspecified depression type -     buPROPion (WELLBUTRIN XL) 150 MG 24 hr tablet; Take 1 tablet (150 mg  total) by mouth daily.  Insomnia, unspecified type -     gabapentin (NEURONTIN) 300 MG capsule; Take 1 capsule (300 mg total) by mouth at bedtime as needed (RLS).  Restless legs syndrome (RLS) -     gabapentin (NEURONTIN) 300 MG capsule; Take 1 capsule (300 mg total) by mouth at bedtime as needed (RLS).  Other orders -     sildenafil (VIAGRA) 100 MG tablet; TAKE ONE-HALF TABLET BY MOUTH DAILY AS NEEDED FOR ERECTILE DYSFUNCTION    Please see After Visit Summary for patient specific instructions.  No future appointments.  No orders of the defined types were placed in this encounter.     -------------------------------

## 2021-09-18 ENCOUNTER — Telehealth: Payer: Self-pay | Admitting: Psychiatry

## 2021-09-18 NOTE — Telephone Encounter (Signed)
Patient Aaron West called requesting a refill on the Adderall. He states it's not available at the [facility] he has on file. Patient inquired and it's available at the [facility] location on [address]. Please send Rx refill there and notify patient when completed per his request. # [phone]. Last seen 8/2 MyChart visit with no follow up scheduled.

## 2021-09-19 ENCOUNTER — Other Ambulatory Visit: Payer: Self-pay

## 2021-09-19 DIAGNOSIS — F902 Attention-deficit hyperactivity disorder, combined type: Secondary | ICD-10-CM

## 2021-09-19 MED ORDER — AMPHETAMINE-DEXTROAMPHETAMINE 15 MG PO TABS: 15.0000 mg | ORAL_TABLET | Freq: Two times a day (BID) | ORAL | 0 refills | Status: DC

## 2021-09-19 NOTE — Telephone Encounter (Signed)
Pended.

## 2021-10-23 ENCOUNTER — Telehealth: Payer: Self-pay | Admitting: Psychiatry

## 2021-10-23 ENCOUNTER — Other Ambulatory Visit: Payer: Self-pay

## 2021-10-23 DIAGNOSIS — F902 Attention-deficit hyperactivity disorder, combined type: Secondary | ICD-10-CM

## 2021-10-23 MED ORDER — AMPHETAMINE-DEXTROAMPHETAMINE 15 MG PO TABS: 15.0000 mg | ORAL_TABLET | Freq: Two times a day (BID) | ORAL | 0 refills | Status: DC

## 2021-10-23 NOTE — Telephone Encounter (Signed)
Pended.

## 2021-10-23 NOTE — Telephone Encounter (Signed)
Vere is requesting a refill on his Adderall. Pharmacy is:  [facility] [id number] - [place], [place] - [address]  Phone:  [phone]  Fax:  [phone]

## 2021-11-25 ENCOUNTER — Telehealth: Payer: Self-pay | Admitting: Psychiatry

## 2021-11-25 ENCOUNTER — Other Ambulatory Visit: Payer: Self-pay

## 2021-11-25 DIAGNOSIS — F902 Attention-deficit hyperactivity disorder, combined type: Secondary | ICD-10-CM

## 2021-11-25 MED ORDER — AMPHETAMINE-DEXTROAMPHETAMINE 15 MG PO TABS: 15.0000 mg | ORAL_TABLET | Freq: Two times a day (BID) | ORAL | 0 refills | Status: DC

## 2021-11-25 NOTE — Telephone Encounter (Signed)
Aaron West called to request refill of his Adderall.  Made appt for 01/07/22.  Send to [facility] at [facility]. Joellyn Quails, [place]

## 2021-11-25 NOTE — Telephone Encounter (Signed)
Pended.

## 2021-12-26 ENCOUNTER — Other Ambulatory Visit: Payer: Self-pay | Admitting: Psychiatry

## 2021-12-26 DIAGNOSIS — F411 Generalized anxiety disorder: Secondary | ICD-10-CM

## 2022-01-05 ENCOUNTER — Other Ambulatory Visit: Payer: Self-pay

## 2022-01-05 ENCOUNTER — Telehealth: Payer: Self-pay | Admitting: Psychiatry

## 2022-01-05 DIAGNOSIS — F902 Attention-deficit hyperactivity disorder, combined type: Secondary | ICD-10-CM

## 2022-01-05 MED ORDER — AMPHETAMINE-DEXTROAMPHETAMINE 15 MG PO TABS: 15.0000 mg | ORAL_TABLET | Freq: Two times a day (BID) | ORAL | 0 refills | Status: DC

## 2022-01-05 NOTE — Telephone Encounter (Signed)
Pended to requested pharmacy.  

## 2022-01-05 NOTE — Telephone Encounter (Signed)
Pt LVM to send Adderall 15mg  to on [facility].  After seeing the original message I called him to see which one was correct.  He confrimed [facility] [place].  He says they don't have it at [facility].     Next appt 1/25

## 2022-01-05 NOTE — Telephone Encounter (Signed)
RF was still pended. Changed to requested pharmacy.

## 2022-01-05 NOTE — Telephone Encounter (Signed)
Pt called and needs a refill on his adderall 15 mg to be sent to the [facility] on [address]

## 2022-01-07 ENCOUNTER — Other Ambulatory Visit: Payer: Self-pay

## 2022-01-07 ENCOUNTER — Encounter: Payer: Self-pay | Admitting: Psychiatry

## 2022-01-07 ENCOUNTER — Ambulatory Visit (INDEPENDENT_AMBULATORY_CARE_PROVIDER_SITE_OTHER): Payer: BC Managed Care – PPO | Admitting: Psychiatry

## 2022-01-07 DIAGNOSIS — F32A Depression, unspecified: Secondary | ICD-10-CM

## 2022-01-07 DIAGNOSIS — G4700 Insomnia, unspecified: Secondary | ICD-10-CM | POA: Diagnosis not present

## 2022-01-07 DIAGNOSIS — G2581 Restless legs syndrome: Secondary | ICD-10-CM

## 2022-01-07 DIAGNOSIS — F411 Generalized anxiety disorder: Secondary | ICD-10-CM | POA: Diagnosis not present

## 2022-01-07 DIAGNOSIS — F902 Attention-deficit hyperactivity disorder, combined type: Secondary | ICD-10-CM

## 2022-01-07 MED ORDER — ALPRAZOLAM 1 MG PO TABS: ORAL_TABLET | ORAL | 2 refills | Status: DC

## 2022-01-07 MED ORDER — SERTRALINE HCL 50 MG PO TABS: ORAL_TABLET | ORAL | 0 refills | Status: DC

## 2022-01-07 MED ORDER — AMPHETAMINE-DEXTROAMPHETAMINE 15 MG PO TABS: 15.0000 mg | ORAL_TABLET | Freq: Two times a day (BID) | ORAL | 0 refills | Status: DC

## 2022-01-07 MED ORDER — SILDENAFIL CITRATE 100 MG PO TABS: ORAL_TABLET | ORAL | 4 refills | Status: DC

## 2022-01-07 MED ORDER — GABAPENTIN 300 MG PO CAPS: 300.0000 mg | ORAL_CAPSULE | Freq: Every evening | ORAL | 2 refills | Status: DC | PRN

## 2022-01-07 MED ORDER — BUPROPION HCL ER (XL) 150 MG PO TB24: 150.0000 mg | ORAL_TABLET | Freq: Every day | ORAL | 1 refills | Status: DC

## 2022-01-07 NOTE — Progress Notes (Signed)
[name] [id number] [birth year] 46 y.o.  Subjective:   Patient ID:  Aaron West is a 46 y.o. (DOB 1975-12-24) male.  Chief Complaint:  Chief Complaint  Patient presents with   Anxiety   Depression    HPI [name] presents to the office today for follow-up of anxiety, depression, and ADHD. He reports increased stress and has been having frequent headaches. Headaches worse with stress and ease some on the weekends. He reports that he has been feeling overwhelmed at times. He reports that he saw his PCP re: headaches and PCP prescribed Sertraline to help with anxiety. He started Sertraline 25 mg about 1.5 weeks ago. Denies panic attacks. He reports some worry about the future. Has had some depression. He reports that he has to try to "stay positive." Energy and motivation have been lower. Denies anhedonia. He reports that his sleep has improved. Appetite has been good. Concentration has been ok. Denies SI.   He reports that he has not been drinking. He reports that he was working out and then got off schedule with this. He reports that he did not need Xanax for about 30 days while he was exercising and not drinking.   He reports that he will have to do 6 more weeks in jail. 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   His girlfriend is in [facility]. Contemplating moving to [facility].   Past Psychiatric Medication Trials: Wellbutrin- Feels that 150 mg po qd has been helpful for irritability. Cannot tolerate 300 mg. Sertraline- Has been helpful. Questioned if it caused wt. Gain. May have caused some sleepiness. Viibryd Prozac- Had adverse effects Doxepin Trazodone Gabapentin- helpful for RLS May have taken Lexapro     Review of Systems:  Review of Systems  Cardiovascular:  Negative for palpitations.  Musculoskeletal:  Negative for gait problem.  Neurological:  Positive for headaches. Negative for tremors.       Denies any recent RLS  Psychiatric/Behavioral:          Please refer to HPI   Medications: I have reviewed the patient's current medications.  Current Outpatient Medications  Medication Sig Dispense Refill   amphetamine-dextroamphetamine (ADDERALL) 15 MG tablet Take 1 tablet by mouth 2 (two) times daily. 60 tablet 0   Multiple Vitamin (MULTIVITAMIN) tablet Take 1 tablet by mouth daily.     [START ON 01/23/2022] ALPRAZolam (XANAX) 1 MG tablet TAKE ONE TABLET BY MOUTH DAILY AS NEEDED FOR ANXIETY 30 tablet 2   amphetamine-dextroamphetamine (ADDERALL) 15 MG tablet Take 1 tablet by mouth 2 (two) times daily. 60 tablet 0   [START ON 02/02/2022] amphetamine-dextroamphetamine (ADDERALL) 15 MG tablet Take 1 tablet by mouth 2 (two) times daily. 60 tablet 0   [START ON 03/02/2022] amphetamine-dextroamphetamine (ADDERALL) 15 MG tablet Take 1 tablet by mouth 2 (two) times daily. 60 tablet 0   [START ON 03/30/2022] amphetamine-dextroamphetamine (ADDERALL) 15 MG tablet Take 1 tablet by mouth 2 (two) times daily. 60 tablet 0   buPROPion (WELLBUTRIN XL) 150 MG 24 hr tablet Take 1 tablet (150 mg total) by mouth daily. 90 tablet 1   gabapentin (NEURONTIN) 300 MG capsule Take 1 capsule (300 mg total) by mouth at bedtime as needed (RLS). 30 capsule 2   sertraline (ZOLOFT) 50 MG tablet Take 1/2-1 tablet daily 90 tablet 0   sildenafil (VIAGRA) 100 MG tablet TAKE ONE-HALF TABLET BY MOUTH DAILY AS NEEDED FOR ERECTILE DYSFUNCTION 10 tablet 4   No current facility-administered medications for this visit.  Medication Side Effects: None  Allergies:  Allergies  Allergen Reactions   Penicillins    Trazodone And Nefazodone     History reviewed. No pertinent past medical history.  Past Medical History, Surgical history, Social history, and Family history were reviewed and updated as appropriate.   Please see review of systems for further details on the patient's review from today.   Objective:   Physical Exam:  BP (!) 151/90    Pulse 63   Physical  Exam Constitutional:      General: He is not in acute distress. Musculoskeletal:        General: No deformity.  Neurological:     Mental Status: He is alert and oriented to person, place, and time.     Coordination: Coordination normal.  Psychiatric:        Attention and Perception: Attention and perception normal. He does not perceive auditory or visual hallucinations.        Mood and Affect: Mood is anxious and depressed. Affect is not labile, blunt, angry or inappropriate.        Speech: Speech normal.        Behavior: Behavior normal.        Thought Content: Thought content normal. Thought content is not paranoid or delusional. Thought content does not include homicidal or suicidal ideation. Thought content does not include homicidal or suicidal plan.        Cognition and Memory: Cognition and memory normal.        Judgment: Judgment normal.     Comments: Insight intact    Lab Review:  No results found for: NA, K, CL, CO2, GLUCOSE, BUN, CREATININE, CALCIUM, PROT, ALBUMIN, AST, ALT, ALKPHOS, BILITOT, GFRNONAA, GFRAA  No results found for: WBC, RBC, HGB, HCT, PLT, MCV, MCH, MCHC, RDW, LYMPHSABS, MONOABS, EOSABS, BASOSABS  No results found for: POCLITH, LITHIUM   No results found for: PHENYTOIN, PHENOBARB, VALPROATE, CBMZ   .res Assessment: Plan:   Pt seen for 30 minutes and time spent discussing treatment plan to improve mood and anxiety. Agree with re-start of Sertraline. Discussed that he has taken 50 mg in the past and may wish to consider increasing Sertraline to 50 mg daily if 25 mg dose is not adequate. He reports that PCP sent in Sertraline. Will send script for Sertraline 50 mg 1/2-1 tab po qd for anxiety and depression to be put on file at his pharmacy. Continue Wellbutrin XL 150 mg po qd for depression.  Continue Adderall 15 mg po BID for ADHD.  Continue Xanax 1 mg po qd prn anxiety.  Continue Gabapentin 300 mg po QHS prn RLS.  Pt to follow-up in 3 months or sooner if  clinically indicated.  Patient advised to contact office with any questions, adverse effects, or acute worsening in signs and symptoms.   Pharoah was seen today for anxiety and depression.  Diagnoses and all orders for this visit:  Generalized anxiety disorder -     sertraline (ZOLOFT) 50 MG tablet; Take 1/2-1 tablet daily -     ALPRAZolam (XANAX) 1 MG tablet; TAKE ONE TABLET BY MOUTH DAILY AS NEEDED FOR ANXIETY -     gabapentin (NEURONTIN) 300 MG capsule; Take 1 capsule (300 mg total) by mouth at bedtime as needed (RLS).  Attention deficit hyperactivity disorder (ADHD), combined type -     amphetamine-dextroamphetamine (ADDERALL) 15 MG tablet; Take 1 tablet by mouth 2 (two) times daily.  Depression, unspecified depression type -     sertraline (  ZOLOFT) 50 MG tablet; Take 1/2-1 tablet daily -     buPROPion (WELLBUTRIN XL) 150 MG 24 hr tablet; Take 1 tablet (150 mg total) by mouth daily.  Insomnia, unspecified type -     gabapentin (NEURONTIN) 300 MG capsule; Take 1 capsule (300 mg total) by mouth at bedtime as needed (RLS).  Restless legs syndrome (RLS) -     gabapentin (NEURONTIN) 300 MG capsule; Take 1 capsule (300 mg total) by mouth at bedtime as needed (RLS).  Other orders -     sildenafil (VIAGRA) 100 MG tablet; TAKE ONE-HALF TABLET BY MOUTH DAILY AS NEEDED FOR ERECTILE DYSFUNCTION     Please see After Visit Summary for patient specific instructions.  No future appointments.  No orders of the defined types were placed in this encounter.   -------------------------------

## 2022-02-26 ENCOUNTER — Encounter: Payer: Self-pay | Admitting: Urology

## 2022-02-26 ENCOUNTER — Other Ambulatory Visit: Payer: Self-pay

## 2022-02-26 ENCOUNTER — Ambulatory Visit (INDEPENDENT_AMBULATORY_CARE_PROVIDER_SITE_OTHER): Payer: BC Managed Care – PPO | Admitting: Urology

## 2022-02-26 VITALS — BP 138/88 | HR 61 | Ht 73.0 in | Wt 200.0 lb

## 2022-02-26 DIAGNOSIS — R300 Dysuria: Secondary | ICD-10-CM | POA: Diagnosis not present

## 2022-02-27 ENCOUNTER — Encounter: Payer: Self-pay | Admitting: Urology

## 2022-02-27 LAB — URINALYSIS, COMPLETE
Bilirubin, UA: NEGATIVE
Glucose, UA: NEGATIVE
Ketones, UA: NEGATIVE
Leukocytes,UA: NEGATIVE
Nitrite, UA: NEGATIVE
Protein,UA: NEGATIVE
RBC, UA: NEGATIVE
Specific Gravity, UA: >1.03 — ABNORMAL HIGH (ref 1.005–1.030)
Urobilinogen, Ur: 0.2 mg/dL (ref 0.2–1.0)
pH, UA: 6 (ref 5.0–7.5)

## 2022-02-27 LAB — MICROSCOPIC EXAMINATION: Epithelial Cells (non renal): NONE SEEN /hpf (ref 0–10)

## 2022-02-27 NOTE — Progress Notes (Signed)
? ?02/26/2022 ?9:42 PM  ? ?Ammie Dalton ?1976/10/10 ?[id number] ? ?Referring provider: Derinda Late, MD ?[address] ?[facility] ?[place],  [place] [address] ? ?Chief Complaint  ?Patient presents with  ? Other  ? ? ?HPI: ?Aaron West is a 46 y.o. male referred for evaluation of dysuria. ? ?1 year history of intermittent dysuria ?Onset symptoms January 2022 and tested positive for chlamydia with urinalysis showing pyuria ?At that time states symptoms never fully resolved with a mild urethral discomfort ?Follow-up testing has been negative for STI on several occasions ?UA 02/11/2022 showed no abnormal cells and rare calcium oxalate crystals.  He was referred for the finding of calcium oxalate crystals. ?He had a KUB which was negative. ?No flank pain or gross hematuria.  Mild abdominal pain at times. ?Intermittent dysuria and frequency which he states is worse when he is not hydrated ? ?PMH: ?No past medical history on file. ? ?Surgical History: ?Past Surgical History:  ?Procedure Laterality Date  ? ANKLE FRACTURE SURGERY    ? APPENDECTOMY    ? CLOSED REDUCTION NASAL FRACTURE    ? SHOULDER SURGERY    ? TONSILLECTOMY    ? ? ?Home Medications:  ?Allergies as of 02/26/2022   ? ?   Reactions  ? Penicillins   ? Trazodone And Nefazodone   ? ?  ? ?  ?Medication List  ?  ? ?  ? Accurate as of February 26, 2022 11:59 PM. If you have any questions, ask your nurse or doctor.  ?  ?  ? ?  ? ?ALPRAZolam 1 MG tablet ?Commonly known as: Duanne Moron ?TAKE ONE TABLET BY MOUTH DAILY AS NEEDED FOR ANXIETY ?  ?amphetamine-dextroamphetamine 15 MG tablet ?Commonly known as: Adderall ?Take 1 tablet by mouth 2 (two) times daily. ?  ?amphetamine-dextroamphetamine 15 MG tablet ?Commonly known as: Adderall ?Take 1 tablet by mouth 2 (two) times daily. ?  ?amphetamine-dextroamphetamine 15 MG tablet ?Commonly known as: Adderall ?Take 1 tablet by mouth 2 (two) times daily. ?  ?amphetamine-dextroamphetamine 15 MG  tablet ?Commonly known as: Adderall ?Take 1 tablet by mouth 2 (two) times daily. ?Start taking on: March 02, 2022 ?  ?amphetamine-dextroamphetamine 15 MG tablet ?Commonly known as: Adderall ?Take 1 tablet by mouth 2 (two) times daily. ?Start taking on: March 30, 2022 ?  ?buPROPion 150 MG 24 hr tablet ?Commonly known as: WELLBUTRIN XL ?Take 1 tablet (150 mg total) by mouth daily. ?  ?gabapentin 300 MG capsule ?Commonly known as: Neurontin ?Take 1 capsule (300 mg total) by mouth at bedtime as needed (RLS). ?  ?multivitamin tablet ?Take 1 tablet by mouth daily. ?  ?sertraline 50 MG tablet ?Commonly known as: ZOLOFT ?Take 1/2-1 tablet daily ?  ?sildenafil 100 MG tablet ?Commonly known as: VIAGRA ?TAKE ONE-HALF TABLET BY MOUTH DAILY AS NEEDED FOR ERECTILE DYSFUNCTION ?  ? ?  ? ? ?Allergies:  ?Allergies  ?Allergen Reactions  ? Penicillins   ? Trazodone And Nefazodone   ? ? ?Family History: ?Family History  ?Problem Relation Age of Onset  ? ADD / ADHD Brother   ? Anxiety disorder Brother   ? Mental illness Maternal Uncle   ? Mental illness Cousin   ? ADD / ADHD Daughter   ? Depression Daughter   ? Drug abuse Daughter   ? Depression Daughter   ? ? ?Social History:  reports that he has quit smoking. He has never used smokeless tobacco. He reports current alcohol use. He  reports current drug use. Drug: Marijuana. ? ? ?Physical Exam: ?BP 138/88   Pulse 61   Ht 6\' 1"  (1.854 m)   Wt 200 lb (90.7 kg)   BMI 26.39 kg/m?   ?Constitutional:  Alert and oriented, No acute distress. ?HEENT: [place] AT, moist mucus membranes.  Trachea midline, no masses. ?Respiratory: Normal respiratory effort, no increased work of breathing. ?Neurologic: Grossly intact, no focal deficits, moving all 4 extremities. ?Psychiatric: Normal mood and affect. ? ?Laboratory Data: ? ?Urinalysis ?Dipstick/microscopy negative ?No crystaluria ? ?Pertinent Imaging: ?The images were not available for review.  CT 10/2020 showed no renal calculi ? ?Assessment & Plan:    ? ?1.  Chronic dysuria ?I do not think his calcium oxalate findings are significant.  Urinalysis was concentrated and the calcium oxalate crystals that were seen were rare.  He has had several UAs which have not shown crystals.  CT 2021 showed no urinary calculi and none of his UAs have shown significant microhematuria ?I discussed possibility of a urethral stricture and discussed cystoscopy ?His symptoms have improved and he does not desire to proceed at this time ?CT imaging would be low yield ?He would like to follow-up as needed ? ? ?Abbie Sons, MD ? ?[facility] ?[address], [address] ?[place], [place] [address] ?[phone] ? ?

## 2022-04-07 ENCOUNTER — Encounter: Payer: Self-pay | Admitting: Psychiatry

## 2022-04-07 ENCOUNTER — Ambulatory Visit (INDEPENDENT_AMBULATORY_CARE_PROVIDER_SITE_OTHER): Payer: BC Managed Care – PPO | Admitting: Psychiatry

## 2022-04-07 DIAGNOSIS — F902 Attention-deficit hyperactivity disorder, combined type: Secondary | ICD-10-CM | POA: Diagnosis not present

## 2022-04-07 DIAGNOSIS — F411 Generalized anxiety disorder: Secondary | ICD-10-CM

## 2022-04-07 DIAGNOSIS — F32A Depression, unspecified: Secondary | ICD-10-CM

## 2022-04-07 MED ORDER — SILDENAFIL CITRATE 100 MG PO TABS: ORAL_TABLET | ORAL | 4 refills | Status: DC

## 2022-04-07 MED ORDER — BUPROPION HCL ER (XL) 150 MG PO TB24: 150.0000 mg | ORAL_TABLET | Freq: Every day | ORAL | 0 refills | Status: DC

## 2022-04-07 MED ORDER — AMPHETAMINE-DEXTROAMPHETAMINE 15 MG PO TABS: 15.0000 mg | ORAL_TABLET | Freq: Two times a day (BID) | ORAL | 0 refills | Status: DC

## 2022-04-07 MED ORDER — SERTRALINE HCL 50 MG PO TABS: ORAL_TABLET | ORAL | 1 refills | Status: DC

## 2022-04-07 MED ORDER — ALPRAZOLAM 1 MG PO TABS: ORAL_TABLET | ORAL | 2 refills | Status: DC

## 2022-04-07 NOTE — Progress Notes (Signed)
Chiron Umsted ?[id number] ?[birth year] ?46 y.o. ? ?Subjective:  ? ?Patient ID:  Aaron West is a 46 y.o. (DOB 05-Apr-1976) male. ? ?Chief Complaint:  ?Chief Complaint  ?Patient presents with  ? Follow-up  ?  Anxiety, depression, ADHD  ? ? ?HPI ?Aaron West presents to the office today for follow-up of anxiety, depression, and ADHD. He reports that Sertraline causes him to feel more "even keeled." Anxiety is now more situational. No longer having frequent headaches. Denies panic attacks. He reports worry and anxious thoughts have been "better." Denies significant depression. Energy and motivation have improved. Appetite has been normal. Concentration has been adequate. Denies SI.  ? ?Recently bought some land. Plans on making job transition.  ? ?He reports intermittent ETOH use. "I just end up drinking too much." Declines assistance with ETOH use. He plans to stop drinking on his own.  He reports that sleep has not been as good with ETOH use. He reports that he will periodically stop drinking for 30 days and notice he feels better.  ? ?Son has been doing well and he has been helping his son.  ? ?Has not been able to exercise as much recently due to finger injury.  ? ?He reports that he has tried taking 1/2 tab of Xanax at times with limited response.  ? ?Past Psychiatric Medication Trials: ?Wellbutrin- Feels that 150 mg po qd has been helpful for irritability. Cannot tolerate 300 mg. ?Sertraline- Has been helpful. Questioned if it caused wt. Gain. May have caused some sleepiness. ?Viibryd ?Prozac- Had adverse effects ?Doxepin ?Trazodone ?Gabapentin- helpful for RLS ?May have taken Lexapro ?  ? ?Review of Systems:  ?Review of Systems  ?Musculoskeletal:  Negative for gait problem.  ?     Torn tendons in left hand.  ?Neurological:  Negative for headaches.  ?Psychiatric/Behavioral:    ?     Please refer to HPI  ? ?Medications: I have reviewed the patient's current medications. ? ?Current Outpatient Medications  ?Medication  Sig Dispense Refill  ? [START ON 05/07/2022] ALPRAZolam (XANAX) 1 MG tablet TAKE ONE TABLET BY MOUTH DAILY AS NEEDED FOR ANXIETY 30 tablet 2  ? amphetamine-dextroamphetamine (ADDERALL) 15 MG tablet Take 1 tablet by mouth 2 (two) times daily. 60 tablet 0  ? amphetamine-dextroamphetamine (ADDERALL) 15 MG tablet Take 1 tablet by mouth 2 (two) times daily. 60 tablet 0  ? [START ON 06/02/2022] amphetamine-dextroamphetamine (ADDERALL) 15 MG tablet Take 1 tablet by mouth 2 (two) times daily. 60 tablet 0  ? [START ON 05/05/2022] amphetamine-dextroamphetamine (ADDERALL) 15 MG tablet Take 1 tablet by mouth 2 (two) times daily. 60 tablet 0  ? [START ON 06/30/2022] amphetamine-dextroamphetamine (ADDERALL) 15 MG tablet Take 1 tablet by mouth 2 (two) times daily. 60 tablet 0  ? buPROPion (WELLBUTRIN XL) 150 MG 24 hr tablet Take 1 tablet (150 mg total) by mouth daily. 90 tablet 0  ? gabapentin (NEURONTIN) 300 MG capsule Take 1 capsule (300 mg total) by mouth at bedtime as needed (RLS). 30 capsule 2  ? Multiple Vitamin (MULTIVITAMIN) tablet Take 1 tablet by mouth daily.    ? sertraline (ZOLOFT) 50 MG tablet Take 1/2-1 tablet daily 90 tablet 1  ? sildenafil (VIAGRA) 100 MG tablet TAKE ONE-HALF TABLET BY MOUTH DAILY AS NEEDED FOR ERECTILE DYSFUNCTION 10 tablet 4  ? ?No current facility-administered medications for this visit.  ? ? ?Medication Side Effects: None ? ?Allergies:  ?Allergies  ?Allergen Reactions  ? Penicillins   ? Trazodone And Nefazodone   ? ? ?  History reviewed. No pertinent past medical history. ? ?Past Medical History, Surgical history, Social history, and Family history were reviewed and updated as appropriate.  ? ?Please see review of systems for further details on the patient's review from today.  ? ?Objective:  ? ?Physical Exam:  ?BP (!) 147/88   Pulse 73  ? ?Physical Exam ?Constitutional:   ?   General: He is not in acute distress. ?Musculoskeletal:     ?   General: No deformity.  ?Neurological:  ?   Mental Status:  He is alert and oriented to person, place, and time.  ?   Coordination: Coordination normal.  ?Psychiatric:     ?   Attention and Perception: Attention and perception normal. He does not perceive auditory or visual hallucinations.     ?   Mood and Affect: Mood normal. Mood is not anxious or depressed. Affect is not labile, blunt, angry or inappropriate.     ?   Speech: Speech normal.     ?   Behavior: Behavior normal.     ?   Thought Content: Thought content normal. Thought content is not paranoid or delusional. Thought content does not include homicidal or suicidal ideation. Thought content does not include homicidal or suicidal plan.     ?   Cognition and Memory: Cognition and memory normal.     ?   Judgment: Judgment normal.  ?   Comments: Insight intact  ? ? ?Lab Review:  ?No results found for: NA, K, CL, CO2, GLUCOSE, BUN, CREATININE, CALCIUM, PROT, ALBUMIN, AST, ALT, ALKPHOS, BILITOT, GFRNONAA, GFRAA ? ?No results found for: WBC, RBC, HGB, HCT, PLT, MCV, MCH, MCHC, RDW, LYMPHSABS, MONOABS, EOSABS, BASOSABS ? ?No results found for: POCLITH, LITHIUM  ? ?No results found for: PHENYTOIN, PHENOBARB, VALPROATE, CBMZ  ? ?.res ?Assessment: Plan:   ?Pt seen for 30 minutes and time spent discussing treatment plan and effects of alcohol on anxiety and insomnia. Discussed possible referrals to help with ETOH misuse and pt declines at this time. Pt advised to call office if he needs assistance with stopping ETOH in the future.  ?Discussed that he could try using Gabapentin prn insomnia.  ?Continue Sertraline 50 mg 1/2-1 tab po qd for depression and anxiety.  ?Continue Wellbutrin XL 150 mg po qd for depression.  ?Continue Adderall 15 mg po BID for ADHD.  ?Continue Xanax 1 mg po qd prn anxiety.  ?Continue Viagra prn.  ?Pt to follow-up in 3 months or sooner if clinically indicated.  ?Patient advised to contact office with any questions, adverse effects, or acute worsening in signs and symptoms. ? ?Aaron West was seen today for  follow-up. ? ?Diagnoses and all orders for this visit: ? ?Depression, unspecified depression type ?-     buPROPion (WELLBUTRIN XL) 150 MG 24 hr tablet; Take 1 tablet (150 mg total) by mouth daily. ?-     sertraline (ZOLOFT) 50 MG tablet; Take 1/2-1 tablet daily ? ?Generalized anxiety disorder ?-     sertraline (ZOLOFT) 50 MG tablet; Take 1/2-1 tablet daily ?-     ALPRAZolam (XANAX) 1 MG tablet; TAKE ONE TABLET BY MOUTH DAILY AS NEEDED FOR ANXIETY ? ?Attention deficit hyperactivity disorder (ADHD), combined type ?-     amphetamine-dextroamphetamine (ADDERALL) 15 MG tablet; Take 1 tablet by mouth 2 (two) times daily. ?-     amphetamine-dextroamphetamine (ADDERALL) 15 MG tablet; Take 1 tablet by mouth 2 (two) times daily. ?-     amphetamine-dextroamphetamine (ADDERALL) 15 MG tablet; Take  1 tablet by mouth 2 (two) times daily. ? ?Other orders ?-     sildenafil (VIAGRA) 100 MG tablet; TAKE ONE-HALF TABLET BY MOUTH DAILY AS NEEDED FOR ERECTILE DYSFUNCTION ? ?  ? ?Please see After Visit Summary for patient specific instructions. ? ?Future Appointments  ?Date Time Provider [place]  ?07/07/2022  3:30 PM Thayer Headings, PMHNP CP-[facility] None  ? ? ?No orders of the defined types were placed in this encounter. ? ? ?------------------------------- ?

## 2022-05-07 ENCOUNTER — Telehealth: Payer: Self-pay | Admitting: Psychiatry

## 2022-05-07 DIAGNOSIS — F902 Attention-deficit hyperactivity disorder, combined type: Secondary | ICD-10-CM

## 2022-05-07 MED ORDER — AMPHETAMINE-DEXTROAMPHETAMINE 15 MG PO TABS: 15.0000 mg | ORAL_TABLET | Freq: Two times a day (BID) | ORAL | 0 refills | Status: DC

## 2022-05-07 NOTE — Telephone Encounter (Signed)
Rx canceled at [facility].

## 2022-05-07 NOTE — Telephone Encounter (Signed)
Pt called at 9:49 am and said that his pharmacy doesn't have his adderall in stock. Please send script to the [facility] on [address] [name] do have it in stock

## 2022-05-07 NOTE — Telephone Encounter (Signed)
Please cancel script that was on file at [facility] for 05/05/22. Please let pt know new script sent to [facility].

## 2022-07-07 ENCOUNTER — Ambulatory Visit: Payer: BC Managed Care – PPO | Admitting: Psychiatry

## 2022-07-09 ENCOUNTER — Encounter: Payer: Self-pay | Admitting: Psychiatry

## 2022-07-09 ENCOUNTER — Ambulatory Visit (INDEPENDENT_AMBULATORY_CARE_PROVIDER_SITE_OTHER): Payer: BC Managed Care – PPO | Admitting: Psychiatry

## 2022-07-09 DIAGNOSIS — G4700 Insomnia, unspecified: Secondary | ICD-10-CM

## 2022-07-09 DIAGNOSIS — F411 Generalized anxiety disorder: Secondary | ICD-10-CM

## 2022-07-09 DIAGNOSIS — F902 Attention-deficit hyperactivity disorder, combined type: Secondary | ICD-10-CM

## 2022-07-09 DIAGNOSIS — G2581 Restless legs syndrome: Secondary | ICD-10-CM

## 2022-07-09 DIAGNOSIS — F32A Depression, unspecified: Secondary | ICD-10-CM

## 2022-07-09 MED ORDER — AMPHETAMINE-DEXTROAMPHETAMINE 15 MG PO TABS: 15.0000 mg | ORAL_TABLET | Freq: Two times a day (BID) | ORAL | 0 refills | Status: DC

## 2022-07-09 MED ORDER — BUPROPION HCL ER (XL) 150 MG PO TB24: 150.0000 mg | ORAL_TABLET | Freq: Every day | ORAL | 1 refills | Status: DC

## 2022-07-09 MED ORDER — SERTRALINE HCL 50 MG PO TABS: ORAL_TABLET | ORAL | 1 refills | Status: DC

## 2022-07-09 MED ORDER — GABAPENTIN 300 MG PO CAPS: 300.0000 mg | ORAL_CAPSULE | Freq: Every evening | ORAL | 2 refills | Status: DC | PRN

## 2022-07-09 MED ORDER — ALPRAZOLAM 1 MG PO TABS: ORAL_TABLET | ORAL | 5 refills | Status: DC

## 2022-07-09 NOTE — Progress Notes (Signed)
Aaron West [id number] June 28, 1976 46 y.o.  Subjective:   Patient ID:  Aaron West is a 46 y.o. (DOB 04/09/1976) male.  Chief Complaint:  Chief Complaint  Patient presents with   Follow-up    Anxiety, depression, insomnia, and ADHD    HPI Aaron West presents to the office today for follow-up of anxiety, depression, ADHD, and insomnia. Anxiety has been "not too bad." Denies depressed mood. Denies excessive irritability. Sleep has been "decent." RLS has been better controlled lately. Energy and motivation have been ok. Not exercising as much as he would like. Appetite has been good. Concentration is ok. Denies SI.   He continues to work his same employer.   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 Served 14 more days in jail and probation ended. When he returned, the other Emergency planning/management officer quit and he has assumed his responsibilities.  Adderall last filled 06/18/22. Xanax last filled 06/18/22 x3  He reports that ETOH use varies.   Past Psychiatric Medication Trials: Wellbutrin- Feels that 150 mg po qd has been helpful for irritability. Cannot tolerate 300 mg. Sertraline- Has been helpful. Questioned if it caused wt. Gain. May have caused some sleepiness. Viibryd Prozac- Had adverse effects Doxepin Trazodone Gabapentin- helpful for RLS May have taken Lexapro     Review of Systems:  Review of Systems  Eyes:        Diminished vision  Cardiovascular:  Negative for palpitations.  Musculoskeletal:  Negative for gait problem.  Neurological:  Negative for tremors.  Psychiatric/Behavioral:         Please refer to HPI    Medications: I have reviewed the patient's current medications.  Current Outpatient Medications  Medication Sig Dispense Refill   sildenafil (VIAGRA) 100 MG tablet TAKE ONE-HALF TABLET BY MOUTH DAILY AS NEEDED FOR ERECTILE DYSFUNCTION 10 tablet 4   [START ON 07/16/2022] ALPRAZolam (XANAX) 1 MG tablet TAKE ONE TABLET BY MOUTH DAILY AS NEEDED FOR ANXIETY 30 tablet 5    amphetamine-dextroamphetamine (ADDERALL) 15 MG tablet Take 1 tablet by mouth 2 (two) times daily. 60 tablet 0   amphetamine-dextroamphetamine (ADDERALL) 15 MG tablet Take 1 tablet by mouth 2 (two) times daily. 60 tablet 0   [START ON 09/10/2022] amphetamine-dextroamphetamine (ADDERALL) 15 MG tablet Take 1 tablet by mouth 2 (two) times daily. 60 tablet 0   [START ON 08/13/2022] amphetamine-dextroamphetamine (ADDERALL) 15 MG tablet Take 1 tablet by mouth 2 (two) times daily. 60 tablet 0   [START ON 10/08/2022] amphetamine-dextroamphetamine (ADDERALL) 15 MG tablet Take 1 tablet by mouth 2 (two) times daily. 60 tablet 0   buPROPion (WELLBUTRIN XL) 150 MG 24 hr tablet Take 1 tablet (150 mg total) by mouth daily. 90 tablet 1   gabapentin (NEURONTIN) 300 MG capsule Take 1 capsule (300 mg total) by mouth at bedtime as needed (RLS). 30 capsule 2   Multiple Vitamin (MULTIVITAMIN) tablet Take 1 tablet by mouth daily. (Patient not taking: Reported on 07/09/2022)     sertraline (ZOLOFT) 50 MG tablet Take 1/2-1 tablet daily 90 tablet 1   No current facility-administered medications for this visit.    Medication Side Effects: None  Allergies:  Allergies  Allergen Reactions   Penicillins    Trazodone And Nefazodone     History reviewed. No pertinent past medical history.  Past Medical History, Surgical history, Social history, and Family history were reviewed and updated as appropriate.   Please see review of systems for further details on the patient's review from today.   Objective:   Physical  Exam:  BP (!) 152/76   Pulse 71   Physical Exam Constitutional:      General: He is not in acute distress. Musculoskeletal:        General: No deformity.  Neurological:     Mental Status: He is alert and oriented to person, place, and time.     Coordination: Coordination normal.  Psychiatric:        Attention and Perception: Attention and perception normal. He does not perceive auditory or visual  hallucinations.        Mood and Affect: Mood normal. Mood is not anxious or depressed. Affect is not labile, blunt, angry or inappropriate.        Speech: Speech normal.        Behavior: Behavior normal.        Thought Content: Thought content normal. Thought content is not paranoid or delusional. Thought content does not include homicidal or suicidal ideation. Thought content does not include homicidal or suicidal plan.        Cognition and Memory: Cognition and memory normal.        Judgment: Judgment normal.     Comments: Insight intact     Lab Review:  No results found for: "NA", "K", "CL", "CO2", "GLUCOSE", "BUN", "CREATININE", "CALCIUM", "PROT", "ALBUMIN", "AST", "ALT", "ALKPHOS", "BILITOT", "GFRNONAA", "GFRAA"  No results found for: "WBC", "RBC", "HGB", "HCT", "PLT", "MCV", "MCH", "MCHC", "RDW", "LYMPHSABS", "MONOABS", "EOSABS", "BASOSABS"  No results found for: "POCLITH", "LITHIUM"   No results found for: "PHENYTOIN", "PHENOBARB", "VALPROATE", "CBMZ"   .res Assessment: Plan:   Will continue current plan of care since target signs and symptoms are well controlled without any tolerability issues. Continue Sertraline 50 mg 1/2-1 tab po qd for depression and anxiety.  Continue Wellbutrin XL 150 mg po qd for depression.  Continue Adderall 15 mg po BID for ADHD.  Continue Xanax 1 mg po qd prn anxiety.  Continue Gabapentin 300 mg po QHS prn RLS, insomnia, and/or anxiety.  Pt to follow-up in 6 months or sooner if clinically indicated.  Requested pt call in 3 months to provide update and request additional scripts.  Patient advised to contact office with any questions, adverse effects, or acute worsening in signs and symptoms.   Aaron West was seen today for follow-up.  Diagnoses and all orders for this visit:  Generalized anxiety disorder -     ALPRAZolam (XANAX) 1 MG tablet; TAKE ONE TABLET BY MOUTH DAILY AS NEEDED FOR ANXIETY -     sertraline (ZOLOFT) 50 MG tablet; Take 1/2-1  tablet daily -     gabapentin (NEURONTIN) 300 MG capsule; Take 1 capsule (300 mg total) by mouth at bedtime as needed (RLS).  Attention deficit hyperactivity disorder (ADHD), combined type -     amphetamine-dextroamphetamine (ADDERALL) 15 MG tablet; Take 1 tablet by mouth 2 (two) times daily. -     amphetamine-dextroamphetamine (ADDERALL) 15 MG tablet; Take 1 tablet by mouth 2 (two) times daily. -     amphetamine-dextroamphetamine (ADDERALL) 15 MG tablet; Take 1 tablet by mouth 2 (two) times daily.  Depression, unspecified depression type -     buPROPion (WELLBUTRIN XL) 150 MG 24 hr tablet; Take 1 tablet (150 mg total) by mouth daily. -     sertraline (ZOLOFT) 50 MG tablet; Take 1/2-1 tablet daily  Insomnia, unspecified type -     gabapentin (NEURONTIN) 300 MG capsule; Take 1 capsule (300 mg total) by mouth at bedtime as needed (RLS).  Restless legs syndrome (RLS) -  gabapentin (NEURONTIN) 300 MG capsule; Take 1 capsule (300 mg total) by mouth at bedtime as needed (RLS).     Please see After Visit Summary for patient specific instructions.  Future Appointments  Date Time Provider Department Center  01/08/2023 10:30 AM Corie Chiquito, PMHNP CP-CP None    No orders of the defined types were placed in this encounter.   -------------------------------

## 2022-07-17 ENCOUNTER — Other Ambulatory Visit: Payer: Self-pay

## 2022-07-17 ENCOUNTER — Telehealth: Payer: Self-pay | Admitting: Psychiatry

## 2022-07-17 NOTE — Telephone Encounter (Signed)
Next appt is 01/08/23. Requesting refill on Adderall 15 mg called to   [id number] [place], [place] - [address]  Phone:  [phone]  Fax:  [phone]    Its in stock at this location.

## 2022-07-17 NOTE — Telephone Encounter (Signed)
Pt has refill on file

## 2022-11-03 ENCOUNTER — Telehealth: Payer: Self-pay | Admitting: Psychiatry

## 2022-11-03 ENCOUNTER — Other Ambulatory Visit: Payer: Self-pay

## 2022-11-03 DIAGNOSIS — F902 Attention-deficit hyperactivity disorder, combined type: Secondary | ICD-10-CM

## 2022-11-03 NOTE — Telephone Encounter (Signed)
Pt called requesting a refill on his adderall 15 mg. Pharmacy is [facility] on [address]

## 2022-11-03 NOTE — Telephone Encounter (Signed)
Pended.

## 2022-11-04 MED ORDER — AMPHETAMINE-DEXTROAMPHETAMINE 15 MG PO TABS: 15.0000 mg | ORAL_TABLET | Freq: Two times a day (BID) | ORAL | 0 refills | Status: DC

## 2022-12-04 ENCOUNTER — Other Ambulatory Visit: Payer: Self-pay

## 2022-12-04 ENCOUNTER — Telehealth: Payer: Self-pay | Admitting: Psychiatry

## 2022-12-04 DIAGNOSIS — F902 Attention-deficit hyperactivity disorder, combined type: Secondary | ICD-10-CM

## 2022-12-04 MED ORDER — AMPHETAMINE-DEXTROAMPHETAMINE 15 MG PO TABS: 15.0000 mg | ORAL_TABLET | Freq: Two times a day (BID) | ORAL | 0 refills | Status: DC

## 2022-12-04 NOTE — Telephone Encounter (Signed)
Pt called asking for a refill on his adderall 15 mg. Pharmacy is [facility] [address]. Next appt 1/26

## 2022-12-04 NOTE — Telephone Encounter (Signed)
Pended.

## 2023-01-08 ENCOUNTER — Ambulatory Visit: Payer: BC Managed Care – PPO | Admitting: Psychiatry

## 2023-01-11 ENCOUNTER — Telehealth: Payer: Self-pay | Admitting: Psychiatry

## 2023-01-11 ENCOUNTER — Other Ambulatory Visit: Payer: Self-pay

## 2023-01-11 DIAGNOSIS — F902 Attention-deficit hyperactivity disorder, combined type: Secondary | ICD-10-CM

## 2023-01-11 MED ORDER — AMPHETAMINE-DEXTROAMPHETAMINE 15 MG PO TABS: 15.0000 mg | ORAL_TABLET | Freq: Two times a day (BID) | ORAL | 0 refills | Status: DC

## 2023-01-11 NOTE — Telephone Encounter (Signed)
Pended.

## 2023-01-11 NOTE — Telephone Encounter (Signed)
Vallie called and LM at 9:48 to RS his missed appt and to request refill of his Adderall.  He RS for 01/20/23.  Send RX refill to [facility] at [facility]

## 2023-01-20 ENCOUNTER — Ambulatory Visit (INDEPENDENT_AMBULATORY_CARE_PROVIDER_SITE_OTHER): Payer: BC Managed Care – PPO | Admitting: Psychiatry

## 2023-01-20 ENCOUNTER — Encounter: Payer: Self-pay | Admitting: Psychiatry

## 2023-01-20 DIAGNOSIS — F32A Depression, unspecified: Secondary | ICD-10-CM

## 2023-01-20 DIAGNOSIS — F411 Generalized anxiety disorder: Secondary | ICD-10-CM

## 2023-01-20 DIAGNOSIS — F902 Attention-deficit hyperactivity disorder, combined type: Secondary | ICD-10-CM | POA: Diagnosis not present

## 2023-01-20 DIAGNOSIS — G2581 Restless legs syndrome: Secondary | ICD-10-CM

## 2023-01-20 DIAGNOSIS — G4700 Insomnia, unspecified: Secondary | ICD-10-CM

## 2023-01-20 MED ORDER — AMPHETAMINE-DEXTROAMPHETAMINE 15 MG PO TABS: 15.0000 mg | ORAL_TABLET | Freq: Two times a day (BID) | ORAL | 0 refills | Status: DC

## 2023-01-20 MED ORDER — ALPRAZOLAM 1 MG PO TABS: ORAL_TABLET | ORAL | 5 refills | Status: DC

## 2023-01-20 MED ORDER — GABAPENTIN 300 MG PO CAPS: 300.0000 mg | ORAL_CAPSULE | Freq: Every evening | ORAL | 2 refills | Status: DC | PRN

## 2023-01-20 MED ORDER — SILDENAFIL CITRATE 100 MG PO TABS: ORAL_TABLET | ORAL | 4 refills | Status: DC

## 2023-01-20 MED ORDER — BUPROPION HCL ER (XL) 150 MG PO TB24: 150.0000 mg | ORAL_TABLET | Freq: Every day | ORAL | 1 refills | Status: DC

## 2023-01-20 MED ORDER — SERTRALINE HCL 50 MG PO TABS: ORAL_TABLET | ORAL | 1 refills | Status: DC

## 2023-01-20 NOTE — Progress Notes (Signed)
Aaron West [id number] [birth year] 47 y.o.  Subjective:   Patient ID:  Aaron West is a 47 y.o. (DOB 1976/06/07) male.  Chief Complaint:  Chief Complaint  Patient presents with   Fatigue   Sleeping Problem    HPI Aaron West presents to the office today for follow-up of anxiety, depression, and ADHD. Aaron West reports that anxiety has been "pretty good." Aaron West reports that depression has been ok. Sleeping well. Aaron West reports feeling tired in the morning and "never feel rested." Aaron West reports that Aaron West snores some. Aaron West reports that girlfriend recently noticed Aaron West would periodically stop breathing in his sleep.  Aaron West reports occasionally waking up during the night. Some difficulty with concentration. Aaron West reports that Aaron West feels drowsy after lunch. Motivation is "ok." Appetite has been ok. Denies SI.   "Probably drinking too much."   Denies any acute stressors.   Adderall last filled 01/11/23. Alprazolam last filled 12/24/22.   Past Psychiatric Medication Trials: Wellbutrin- Feels that 150 mg po qd has been helpful for irritability. Cannot tolerate 300 mg. Sertraline- Has been helpful. Questioned if it caused wt. Gain. May have caused some sleepiness. Viibryd Prozac- Had adverse effects Doxepin Trazodone Gabapentin- helpful for RLS May have taken Lexapro  Review of Systems:  Review of Systems  Constitutional:  Positive for fatigue.  Cardiovascular:  Negative for palpitations.  Musculoskeletal:  Negative for gait problem.  Neurological:  Negative for tremors.  Psychiatric/Behavioral:         Please refer to HPI    Aaron West has had some RLS, typically after waking up at night.   Medications: I have reviewed the patient's current medications.  Current Outpatient Medications  Medication Sig Dispense Refill   Multiple Vitamin (MULTIVITAMIN) tablet Take 1 tablet by mouth daily.     ALPRAZolam (XANAX) 1 MG tablet TAKE ONE TABLET BY MOUTH DAILY AS NEEDED FOR ANXIETY 30 tablet 5    amphetamine-dextroamphetamine (ADDERALL) 15 MG tablet Take 1 tablet by mouth 2 (two) times daily. 60 tablet 0   amphetamine-dextroamphetamine (ADDERALL) 15 MG tablet Take 1 tablet by mouth 2 (two) times daily. 60 tablet 0   amphetamine-dextroamphetamine (ADDERALL) 15 MG tablet Take 1 tablet by mouth 2 (two) times daily. 60 tablet 0   amphetamine-dextroamphetamine (ADDERALL) 15 MG tablet Take 1 tablet by mouth 2 (two) times daily. 60 tablet 0   [START ON 02/08/2023] amphetamine-dextroamphetamine (ADDERALL) 15 MG tablet Take 1 tablet by mouth 2 (two) times daily. 60 tablet 0   buPROPion (WELLBUTRIN XL) 150 MG 24 hr tablet Take 1 tablet (150 mg total) by mouth daily. 90 tablet 1   gabapentin (NEURONTIN) 300 MG capsule Take 1 capsule (300 mg total) by mouth at bedtime as needed (RLS). 30 capsule 2   sertraline (ZOLOFT) 50 MG tablet Take 1/2-1 tablet daily 90 tablet 1   sildenafil (VIAGRA) 100 MG tablet TAKE ONE-HALF TABLET BY MOUTH DAILY AS NEEDED FOR ERECTILE DYSFUNCTION 30 tablet 4   No current facility-administered medications for this visit.    Medication Side Effects: None  Allergies:  Allergies  Allergen Reactions   Penicillins    Trazodone And Nefazodone     History reviewed. No pertinent past medical history.  Past Medical History, Surgical history, Social history, and Family history were reviewed and updated as appropriate.   Please see review of systems for further details on the patient's review from today.   Objective:   Physical Exam:  BP (!) 150/85   Pulse 64   Physical Exam  Lab Review:  No results found for: "NA", "K", "CL", "CO2", "GLUCOSE", "BUN", "CREATININE", "CALCIUM", "PROT", "ALBUMIN", "AST", "ALT", "ALKPHOS", "BILITOT", "GFRNONAA", "GFRAA"  No results found for: "WBC", "RBC", "HGB", "HCT", "PLT", "MCV", "MCH", "MCHC", "RDW", "LYMPHSABS", "MONOABS", "EOSABS", "BASOSABS"  No results found for: "POCLITH", "LITHIUM"   No results found for: "PHENYTOIN",  "PHENOBARB", "VALPROATE", "CBMZ"   .res Assessment: Plan:   Discussed considering a sleep study to rule out sleep apnea as cause of chronic daytime somnolence, fatigue, and concentration difficulties particularly since Aaron West has been told that Aaron West snores and periodically stops breathing in his sleep. Aaron West has an Epworth Sleepiness Scale score of 10 and his neck size is 17 3/4. Aaron West reports that Aaron West would first like to determine if his insurance will cover an in-home sleep study and if so, what his projected out of pocket cost may be. Will request office staff to assist him with this.  Discussed that his BP remains somewhat elevated and recommended follow-up with PCP for treatment of HTN.  Will continue Adderall 15 mg po BID for ADHD.  Continue Alprazolam 1 mg po qd prn anxiety.  Will continue Wellbutrin XL 150 mg po qd for depression.  Continue Gabapentin 300 mg po QHS prn RLS.  Continue Sertraline 50 mg po qd for anxiety and depression.  Pt to follow-up in 2 months or sooner if clinically indicated.  Patient advised to contact office with any questions, adverse effects, or acute worsening in signs and symptoms.   Aaron West was seen today for fatigue and sleeping problem.  Diagnoses and all orders for this visit:  Attention deficit hyperactivity disorder (ADHD), combined type -     amphetamine-dextroamphetamine (ADDERALL) 15 MG tablet; Take 1 tablet by mouth 2 (two) times daily.  Generalized anxiety disorder -     ALPRAZolam (XANAX) 1 MG tablet; TAKE ONE TABLET BY MOUTH DAILY AS NEEDED FOR ANXIETY -     sertraline (ZOLOFT) 50 MG tablet; Take 1/2-1 tablet daily -     gabapentin (NEURONTIN) 300 MG capsule; Take 1 capsule (300 mg total) by mouth at bedtime as needed (RLS).  Depression, unspecified depression type -     sertraline (ZOLOFT) 50 MG tablet; Take 1/2-1 tablet daily -     buPROPion (WELLBUTRIN XL) 150 MG 24 hr tablet; Take 1 tablet (150 mg total) by mouth daily.  Restless legs syndrome  (RLS) -     gabapentin (NEURONTIN) 300 MG capsule; Take 1 capsule (300 mg total) by mouth at bedtime as needed (RLS).  Insomnia, unspecified type -     gabapentin (NEURONTIN) 300 MG capsule; Take 1 capsule (300 mg total) by mouth at bedtime as needed (RLS).  Other orders -     sildenafil (VIAGRA) 100 MG tablet; TAKE ONE-HALF TABLET BY MOUTH DAILY AS NEEDED FOR ERECTILE DYSFUNCTION     Please see After Visit Summary for patient specific instructions.  Future Appointments  Date Time Provider [place]  03/24/2023  1:30 PM Thayer Headings, PMHNP CP-CP None    No orders of the defined types were placed in this encounter.   -------------------------------

## 2023-03-15 ENCOUNTER — Telehealth: Payer: Self-pay | Admitting: Psychiatry

## 2023-03-15 DIAGNOSIS — F902 Attention-deficit hyperactivity disorder, combined type: Secondary | ICD-10-CM

## 2023-03-15 MED ORDER — AMPHETAMINE-DEXTROAMPHETAMINE 15 MG PO TABS: 15.0000 mg | ORAL_TABLET | Freq: Two times a day (BID) | ORAL | 0 refills | Status: DC

## 2023-03-15 NOTE — Telephone Encounter (Signed)
Script sent  

## 2023-03-15 NOTE — Telephone Encounter (Signed)
Pt requesting Rx adderall [facility]. In stock Apt  4/10

## 2023-03-24 ENCOUNTER — Ambulatory Visit: Payer: BC Managed Care – PPO | Admitting: Psychiatry

## 2023-03-24 ENCOUNTER — Encounter: Payer: Self-pay | Admitting: Psychiatry

## 2023-03-24 VITALS — BP 166/81 | HR 67

## 2023-03-24 DIAGNOSIS — F411 Generalized anxiety disorder: Secondary | ICD-10-CM | POA: Diagnosis not present

## 2023-03-24 DIAGNOSIS — F32A Depression, unspecified: Secondary | ICD-10-CM

## 2023-03-24 DIAGNOSIS — F902 Attention-deficit hyperactivity disorder, combined type: Secondary | ICD-10-CM

## 2023-03-24 MED ORDER — BUPROPION HCL ER (XL) 150 MG PO TB24: 150.0000 mg | ORAL_TABLET | Freq: Every day | ORAL | 1 refills | Status: AC

## 2023-03-24 MED ORDER — AMPHETAMINE-DEXTROAMPHETAMINE 10 MG PO TABS
10.0000 mg | ORAL_TABLET | Freq: Two times a day (BID) | ORAL | 0 refills | Status: DC
Start: 2023-05-12 — End: 2023-09-20

## 2023-03-24 MED ORDER — AMPHETAMINE-DEXTROAMPHETAMINE 10 MG PO TABS
10.0000 mg | ORAL_TABLET | Freq: Two times a day (BID) | ORAL | 0 refills | Status: DC
Start: 2023-06-09 — End: 2023-11-03

## 2023-03-24 MED ORDER — AMPHETAMINE-DEXTROAMPHETAMINE 10 MG PO TABS: 10.0000 mg | ORAL_TABLET | Freq: Two times a day (BID) | ORAL | 0 refills | Status: DC

## 2023-03-24 MED ORDER — ALPRAZOLAM 1 MG PO TABS: ORAL_TABLET | ORAL | 2 refills | Status: DC

## 2023-03-24 NOTE — Progress Notes (Signed)
Aaron West Ries [id number] 02/05/1976 47 y.o.  Subjective:   Patient ID:  Aaron West is a 47 y.o. (DOB 10/14/1976) male.  Chief Complaint:  Chief Complaint  Patient presents with   Follow-up    Anxiety, depression, ADHD    HPI Aaron West presents to the office today for follow-up of anxiety, depression, ADHD, and sleep disturbance. He reports that he has decided to hold off on sleep study.   Denies persistent depression. He reports some occasional sad mood in response to situational factors. He reports that his motivation has been low. He has not felt like going to the gym. He reports that his energy is lower. He reports that he will "get side tracked" and lose focus. Describes some procrastination. He reports that his anxiety has been high and reports that anxiety may be keeping him from doing things. He reports having anxiety in anticipation of meetings. He reports that he is sleeping ok. Denies recent RLS. Notices sleep improves when he has not had ETOH for about a week. Appetite has been ok. Denies SI.   He reports that his ETOH use varies. Denies ETOH cravings.   Adderall last filled 03/17/23.  Alprazolam last filled 02/22/23 x 2.    Past Psychiatric Medication Trials: Wellbutrin- Feels that 150 mg po qd has been helpful for irritability. Cannot tolerate 300 mg. Sertraline- Has been helpful. Questioned if it caused wt. Gain. May have caused some sleepiness. Viibryd Prozac- Had adverse effects Doxepin Trazodone Gabapentin- helpful for RLS May have taken Lexapro  Review of Systems:  Review of Systems  Gastrointestinal: Negative.   Musculoskeletal:  Negative for gait problem.  Neurological:  Negative for headaches.  Psychiatric/Behavioral:         Please refer to HPI    Has upcoming apt in June with PCP.   Medications: I have reviewed the patient's current medications.  Current Outpatient Medications  Medication Sig Dispense Refill   [START ON 04/14/2023]  amphetamine-dextroamphetamine (ADDERALL) 10 MG tablet Take 1 tablet (10 mg total) by mouth 2 (two) times daily with a meal. 60 tablet 0   [START ON 05/12/2023] amphetamine-dextroamphetamine (ADDERALL) 10 MG tablet Take 1 tablet (10 mg total) by mouth 2 (two) times daily with a meal. 60 tablet 0   [START ON 06/09/2023] amphetamine-dextroamphetamine (ADDERALL) 10 MG tablet Take 1 tablet (10 mg total) by mouth 2 (two) times daily with a meal. 60 tablet 0   sertraline (ZOLOFT) 50 MG tablet Take 1/2-1 tablet daily 90 tablet 1   ALPRAZolam (XANAX) 1 MG tablet TAKE ONE TABLET BY MOUTH TWICE DAILY AS NEEDED FOR ANXIETY 45 tablet 2   buPROPion (WELLBUTRIN XL) 150 MG 24 hr tablet Take 1 tablet (150 mg total) by mouth daily. 90 tablet 1   gabapentin (NEURONTIN) 300 MG capsule Take 1 capsule (300 mg total) by mouth at bedtime as needed (RLS). 30 capsule 2   Multiple Vitamin (MULTIVITAMIN) tablet Take 1 tablet by mouth daily.     sildenafil (VIAGRA) 100 MG tablet TAKE ONE-HALF TABLET BY MOUTH DAILY AS NEEDED FOR ERECTILE DYSFUNCTION 30 tablet 4   No current facility-administered medications for this visit.    Medication Side Effects: None  Allergies:  Allergies  Allergen Reactions   Penicillins    Trazodone And Nefazodone     History reviewed. No pertinent past medical history.  Past Medical History, Surgical history, Social history, and Family history were reviewed and updated as appropriate.   Please see review of systems for further details on  the patient's review from today.   Objective:   Physical Exam:  BP (!) 166/81   Pulse 67   Physical Exam Constitutional:      General: He is not in acute distress. Musculoskeletal:        General: No deformity.  Neurological:     Mental Status: He is alert and oriented to person, place, and time.     Coordination: Coordination normal.  Psychiatric:        Attention and Perception: Attention and perception normal. He does not perceive auditory or  visual hallucinations.        Mood and Affect: Mood is anxious. Affect is not labile, blunt, angry or inappropriate.        Speech: Speech normal.        Behavior: Behavior normal. Behavior is cooperative.        Thought Content: Thought content normal. Thought content is not paranoid or delusional. Thought content does not include homicidal or suicidal ideation. Thought content does not include homicidal or suicidal plan.        Cognition and Memory: Cognition and memory normal.        Judgment: Judgment normal.     Comments: Insight intact Dysthymic mood     Lab Review:  No results found for: "NA", "K", "CL", "CO2", "GLUCOSE", "BUN", "CREATININE", "CALCIUM", "PROT", "ALBUMIN", "AST", "ALT", "ALKPHOS", "BILITOT", "GFRNONAA", "GFRAA"  No results found for: "WBC", "RBC", "HGB", "HCT", "PLT", "MCV", "MCH", "MCHC", "RDW", "LYMPHSABS", "MONOABS", "EOSABS", "BASOSABS"  No results found for: "POCLITH", "LITHIUM"   No results found for: "PHENYTOIN", "PHENOBARB", "VALPROATE", "CBMZ"   .res Assessment: Plan:   Discussed that BP remains somewhat elevated. He reports that he would like to make lifestyle changes to include stopping ETOH, exercising, changing his diet, and losing weight instead of starting treatment for elevated BP or making major changes to his medications since these changes have been helpful for his mood and anxiety in the past.  Discussed Naltrexone or Antabuse to possibly assist with stopping alcohol use. 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 He reports that recently Alprazolam seems to be more helpful for procrastination and low motivation compared to Adderall. He asks about an increase in Xanax. Discussed that taking higher doses of Xanax in combination with Adderall is not recommended. He reports that he agrees to lowering Adderall dose if he could increase Xanax slightly. Will decrease Adderall to 10 mg po BID for ADHD. Will increase Xanax to 1 mg po BID  prn #45 a month for anxiety.  Pt reports that he has upcoming physical exam in June and plans to ask PCP to rule out any medical causes for low motivation and fatigue at that time.  Continue Sertraline 50 mg po qd for anxiety and depression.  Continue Wellbutrin XL 150 mg po qd for depression.  Continue Gabapentin 300 mg po QHS prn RLS.  Pt to follow-up in 2 months or sooner if clinically indicated.  Patient advised to contact office with any questions, adverse effects, or acute worsening in signs and symptoms.   Myrel was seen today for follow-up.  Diagnoses and all orders for this visit:  Attention deficit hyperactivity disorder (ADHD), combined type -     amphetamine-dextroamphetamine (ADDERALL) 10 MG tablet; Take 1 tablet (10 mg total) by mouth 2 (two) times daily with a meal. -     amphetamine-dextroamphetamine (ADDERALL) 10 MG tablet; Take 1 tablet (10 mg total) by mouth 2 (two) times daily with a meal. -  amphetamine-dextroamphetamine (ADDERALL) 10 MG tablet; Take 1 tablet (10 mg total) by mouth 2 (two) times daily with a meal.  Generalized anxiety disorder -     ALPRAZolam (XANAX) 1 MG tablet; TAKE ONE TABLET BY MOUTH TWICE DAILY AS NEEDED FOR ANXIETY  Depression, unspecified depression type -     buPROPion (WELLBUTRIN XL) 150 MG 24 hr tablet; Take 1 tablet (150 mg total) by mouth daily.     Please see After Visit Summary for patient specific instructions.  Future Appointments  Date Time Provider Department Center  05/27/2023  2:30 PM Corie Chiquito, PMHNP [facility] None    No orders of the defined types were placed in this encounter.   -------------------------------

## 2023-05-27 ENCOUNTER — Ambulatory Visit (INDEPENDENT_AMBULATORY_CARE_PROVIDER_SITE_OTHER): Payer: BC Managed Care – PPO | Admitting: Psychiatry

## 2023-05-27 ENCOUNTER — Encounter: Payer: Self-pay | Admitting: Psychiatry

## 2023-05-27 DIAGNOSIS — G2581 Restless legs syndrome: Secondary | ICD-10-CM

## 2023-05-27 DIAGNOSIS — F411 Generalized anxiety disorder: Secondary | ICD-10-CM | POA: Diagnosis not present

## 2023-05-27 DIAGNOSIS — G4700 Insomnia, unspecified: Secondary | ICD-10-CM | POA: Diagnosis not present

## 2023-05-27 DIAGNOSIS — F902 Attention-deficit hyperactivity disorder, combined type: Secondary | ICD-10-CM | POA: Diagnosis not present

## 2023-05-27 DIAGNOSIS — F32A Depression, unspecified: Secondary | ICD-10-CM

## 2023-05-27 MED ORDER — SILDENAFIL CITRATE 100 MG PO TABS: ORAL_TABLET | ORAL | 4 refills | Status: AC

## 2023-05-27 MED ORDER — AMPHETAMINE-DEXTROAMPHETAMINE 10 MG PO TABS
10.0000 mg | ORAL_TABLET | Freq: Two times a day (BID) | ORAL | 0 refills | Status: DC
Start: 2023-07-07 — End: 2023-11-03

## 2023-05-27 MED ORDER — GABAPENTIN 300 MG PO CAPS
300.0000 mg | ORAL_CAPSULE | Freq: Every evening | ORAL | 2 refills | Status: AC | PRN
Start: 2023-05-27 — End: 2023-06-26

## 2023-05-27 MED ORDER — ALPRAZOLAM 1 MG PO TABS
ORAL_TABLET | ORAL | 5 refills | Status: DC
Start: 2023-06-16 — End: 2023-11-29

## 2023-05-27 MED ORDER — SERTRALINE HCL 50 MG PO TABS
ORAL_TABLET | ORAL | 1 refills | Status: AC
Start: 2023-05-27 — End: ?

## 2023-05-27 NOTE — Progress Notes (Signed)
Aaron West [id number] 06-Nov-1976 47 y.o.  Subjective:   Patient ID:  Aaron West is a 47 y.o. (DOB 07/04/1976) male.  Chief Complaint:  Chief Complaint  Patient presents with   Follow-up    Anxiety, depression, and ADHD    HPI Aaron West presents to the office today for follow-up of anxiety, depression, and ADHD.   He reports that PCP started med for BP. He reports that he has not had ETOH in last 6 weeks and has been trying to exercise. He reports, "I'm feeling better." He reports that his BP has improved some.   He reports anxiety is "much better." He reports that his mood has been "good." He reports improved sleep. He reports that he is now sleeping through the night. He reports that he was having some RLS. He reports improved energy and motivation. Appetite has been "too good." He reports that he has been trying to eat healthier and eat more meals at home. He reports that he would like to lose 10 lbs in the next 6 months. Concentration is adequate. Denies SI.   He reports that he has been doing more productive activities and has been helping son with his house. He has been traveling some.   Alprazolam last filled 04/21/23 x 2. Adderall last filled 04/15/23.   Past Psychiatric Medication Trials: Wellbutrin- Feels that 150 mg po qd has been helpful for irritability. Cannot tolerate 300 mg. Sertraline- Has been helpful. Questioned if it caused wt. Gain. May have caused some sleepiness. Viibryd Prozac- Had adverse effects Doxepin Trazodone Gabapentin- helpful for RLS May have taken Lexapro    Review of Systems:  Review of Systems  Musculoskeletal:  Negative for gait problem.  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 Reports family history of varicose veins.   Psychiatric/Behavioral:         Please refer to HPI    Medications: I have reviewed the patient's current medications.  Current Outpatient Medications  Medication Sig  Dispense Refill   [START ON 06/16/2023] ALPRAZolam (XANAX) 1 MG tablet TAKE ONE TABLET BY MOUTH TWICE DAILY AS NEEDED FOR ANXIETY 45 tablet 5   amLODipine (NORVASC) 5 MG tablet Take 5 mg by mouth daily. (Patient not taking: Reported on 05/27/2023)     amphetamine-dextroamphetamine (ADDERALL) 10 MG tablet Take 1 tablet (10 mg total) by mouth 2 (two) times daily with a meal. 60 tablet 0   [START ON 06/09/2023] amphetamine-dextroamphetamine (ADDERALL) 10 MG tablet Take 1 tablet (10 mg total) by mouth 2 (two) times daily with a meal. 60 tablet 0   [START ON 07/07/2023] amphetamine-dextroamphetamine (ADDERALL) 10 MG tablet Take 1 tablet (10 mg total) by mouth 2 (two) times daily with a meal. 60 tablet 0   buPROPion (WELLBUTRIN XL) 150 MG 24 hr tablet Take 1 tablet (150 mg total) by mouth daily. 90 tablet 1   gabapentin (NEURONTIN) 300 MG capsule Take 1 capsule (300 mg total) by mouth at bedtime as needed (RLS). 30 capsule 2   Multiple Vitamin (MULTIVITAMIN) tablet Take 1 tablet by mouth daily.     sertraline (ZOLOFT) 50 MG tablet Take 1/2-1 tablet daily 90 tablet 1   sildenafil (VIAGRA) 100 MG tablet TAKE ONE-HALF TABLET BY MOUTH DAILY AS NEEDED FOR ERECTILE DYSFUNCTION 30 tablet 4   No current facility-administered medications for this visit.    Medication Side Effects: None  Allergies:  Allergies  Allergen Reactions   Penicillins  Trazodone And Nefazodone     History reviewed. No pertinent past medical history.  Past Medical History, Surgical history, Social history, and Family history were reviewed and updated as appropriate.   Please see review of systems for further details on the patient's review from today.   Objective:   Physical Exam:  BP (!) 151/84   Pulse 70   Wt 220 lb (99.8 kg)   BMI 29.03 kg/m   Physical Exam Constitutional:      General: He is not in acute distress. Musculoskeletal:        General: No deformity.  Neurological:     Mental Status: He is alert and  oriented to person, place, and time.     Coordination: Coordination normal.  Psychiatric:        Attention and Perception: Attention and perception normal. He does not perceive auditory or visual hallucinations.        Mood and Affect: Mood normal. Mood is not anxious or depressed. Affect is not labile, blunt, angry or inappropriate.        Speech: Speech normal.        Behavior: Behavior normal.        Thought Content: Thought content normal. Thought content is not paranoid or delusional. Thought content does not include homicidal or suicidal ideation. Thought content does not include homicidal or suicidal plan.        Cognition and Memory: Cognition and memory normal.        Judgment: Judgment normal.     Comments: Insight intact     Lab Review:  No results found for: "NA", "K", "CL", "CO2", "GLUCOSE", "BUN", "CREATININE", "CALCIUM", "PROT", "ALBUMIN", "AST", "ALT", "ALKPHOS", "BILITOT", "GFRNONAA", "GFRAA"  No results found for: "WBC", "RBC", "HGB", "HCT", "PLT", "MCV", "MCH", "MCHC", "RDW", "LYMPHSABS", "MONOABS", "EOSABS", "BASOSABS"  No results found for: "POCLITH", "LITHIUM"   No results found for: "PHENYTOIN", "PHENOBARB", "VALPROATE", "CBMZ"    Labs reviewed from 05/24/23.   Marland Kitchenres Assessment: Plan:    Will continue current plan of care without changes at this time. Will continue Wellbutrin XL 150 mg daily for depression. Will continue Adderall 10 mg twice daily for attention deficit disorder. Will continue sertraline 50 mg 1/2 to 1 tablet daily for anxiety and depression. Continue gabapentin 300 mg at bedtime as needed for RLS. Continue sildenafil 100 mg 1/2 tablet daily as needed for medication side effects. Continue alprazolam 1 mg twice daily as needed for anxiety. Pt to follow-up in 6 months or sooner if clinically indicated.  Requested pt call in 3 months to provide update and request additional scripts.  Patient advised to contact office with any questions, adverse  effects, or acute worsening in signs and symptoms.   Aaron West was seen today for follow-up.  Diagnoses and all orders for this visit:  Generalized anxiety disorder -     ALPRAZolam (XANAX) 1 MG tablet; TAKE ONE TABLET BY MOUTH TWICE DAILY AS NEEDED FOR ANXIETY -     gabapentin (NEURONTIN) 300 MG capsule; Take 1 capsule (300 mg total) by mouth at bedtime as needed (RLS). -     sertraline (ZOLOFT) 50 MG tablet; Take 1/2-1 tablet daily  Attention deficit hyperactivity disorder (ADHD), combined type -     amphetamine-dextroamphetamine (ADDERALL) 10 MG tablet; Take 1 tablet (10 mg total) by mouth 2 (two) times daily with a meal.  Restless legs syndrome (RLS) -     gabapentin (NEURONTIN) 300 MG capsule; Take 1 capsule (300 mg total) by mouth at  bedtime as needed (RLS).  Insomnia, unspecified type -     gabapentin (NEURONTIN) 300 MG capsule; Take 1 capsule (300 mg total) by mouth at bedtime as needed (RLS).  Depression, unspecified depression type -     sertraline (ZOLOFT) 50 MG tablet; Take 1/2-1 tablet daily  Other orders -     sildenafil (VIAGRA) 100 MG tablet; TAKE ONE-HALF TABLET BY MOUTH DAILY AS NEEDED FOR ERECTILE DYSFUNCTION     Please see After Visit Summary for patient specific instructions.  Future Appointments  Date Time Provider Department Center  12/01/2023  1:30 PM Corie Chiquito, PMHNP [facility] None    No orders of the defined types were placed in this encounter.   -------------------------------

## 2023-06-26 ENCOUNTER — Other Ambulatory Visit: Payer: Self-pay

## 2023-06-26 ENCOUNTER — Emergency Department (HOSPITAL_COMMUNITY)
Admission: EM | Admit: 2023-06-26 | Discharge: 2023-06-27 | Disposition: A | Discharge status: Home / Self Care | Payer: BC Managed Care – PPO | Attending: Emergency Medicine | Admitting: Emergency Medicine

## 2023-06-26 ENCOUNTER — Encounter (HOSPITAL_COMMUNITY): Payer: Self-pay | Admitting: *Deleted

## 2023-06-26 ENCOUNTER — Emergency Department (HOSPITAL_COMMUNITY): Payer: BC Managed Care – PPO

## 2023-06-26 DIAGNOSIS — S62306A Unspecified fracture of fifth metacarpal bone, right hand, initial encounter for closed fracture: Secondary | ICD-10-CM | POA: Insufficient documentation

## 2023-06-26 DIAGNOSIS — S01111A Laceration without foreign body of right eyelid and periocular area, initial encounter: Secondary | ICD-10-CM | POA: Insufficient documentation

## 2023-06-26 DIAGNOSIS — S65311A Laceration of deep palmar arch of right hand, initial encounter: Secondary | ICD-10-CM | POA: Insufficient documentation

## 2023-06-26 DIAGNOSIS — Y9301 Activity, walking, marching and hiking: Secondary | ICD-10-CM | POA: Insufficient documentation

## 2023-06-26 DIAGNOSIS — Z23 Encounter for immunization: Secondary | ICD-10-CM | POA: Diagnosis not present

## 2023-06-26 DIAGNOSIS — S61411A Laceration without foreign body of right hand, initial encounter: Secondary | ICD-10-CM | POA: Diagnosis present

## 2023-06-26 DIAGNOSIS — W01118A Fall on same level from slipping, tripping and stumbling with subsequent striking against other sharp object, initial encounter: Secondary | ICD-10-CM | POA: Insufficient documentation

## 2023-06-26 MED ORDER — KETOROLAC TROMETHAMINE 30 MG/ML IJ SOLN
30.0000 mg | Freq: Once | INTRAMUSCULAR | Status: AC
  Administered 2023-06-26: 30 mg via INTRAVENOUS
  Filled 2023-06-26: qty 1

## 2023-06-26 MED ORDER — ACETAMINOPHEN 325 MG PO TABS
650.0000 mg | ORAL_TABLET | Freq: Once | ORAL | Status: AC
  Administered 2023-06-26: 650 mg via ORAL
  Filled 2023-06-26: qty 2

## 2023-06-26 MED ORDER — HYDROMORPHONE HCL 1 MG/ML IJ SOLN
2.0000 mg | Freq: Once | INTRAMUSCULAR | Status: AC
  Administered 2023-06-26: 2 mg via INTRAVENOUS
  Filled 2023-06-26: qty 2

## 2023-06-26 MED ORDER — CEFAZOLIN SODIUM-DEXTROSE 2-4 GM/100ML-% IV SOLN
2.0000 g | Freq: Once | INTRAVENOUS | Status: DC
  Filled 2023-06-26: qty 100

## 2023-06-26 MED ORDER — CEFAZOLIN SODIUM-DEXTROSE 2-4 GM/100ML-% IV SOLN
2.0000 g | Freq: Once | INTRAVENOUS | Status: AC
  Administered 2023-06-26: 2 g via INTRAVENOUS
  Filled 2023-06-26: qty 100

## 2023-06-26 MED ORDER — MORPHINE SULFATE (PF) 2 MG/ML IV SOLN
1.0000 mg | Freq: Once | INTRAVENOUS | Status: AC
  Administered 2023-06-26: 1 mg via INTRAVENOUS
  Filled 2023-06-26: qty 1

## 2023-06-26 MED ORDER — TETANUS-DIPHTH-ACELL PERTUSSIS 5-2.5-18.5 LF-MCG/0.5 IM SUSY
0.5000 mL | PREFILLED_SYRINGE | Freq: Once | INTRAMUSCULAR | Status: AC
  Administered 2023-06-26: 0.5 mL via INTRAMUSCULAR
  Filled 2023-06-26: qty 0.5

## 2023-06-26 MED ORDER — LIDOCAINE HCL (PF) 1 % IJ SOLN
30.0000 mL | Freq: Once | INTRAMUSCULAR | Status: AC
  Administered 2023-06-26: 30 mL
  Filled 2023-06-26: qty 30

## 2023-06-26 MED ORDER — HYDROMORPHONE HCL 1 MG/ML IJ SOLN
1.0000 mg | Freq: Once | INTRAMUSCULAR | Status: AC
  Administered 2023-06-26: 1 mg via INTRAMUSCULAR
  Filled 2023-06-26: qty 1

## 2023-06-26 NOTE — ED Provider Notes (Addendum)
[place] [facility]MENT AT [facility] Provider Note   CSN: [id number] Arrival date & time: 06/26/23  1952     History  Chief Complaint  Patient presents with   Marletta Lor    Aaron West is a 47 y.o. male right hand dominant, who presents with concern for a right hand laceration sustained a couple hours ago.  He was walking over a stream and tripped and fell and cut his hand on a rock.  He also sustained a laceration to the right temple.  He denies any loss of consciousness, change in his vision, headache. Bleeding was well-controlled upon arrival.  Denies any loss of sensation in his right hand.  Reports 10 out of 10 pain in the right hand.  Does not know when tetanus was last updated. He works as a Product/process development scientist.    Fall       Home Medications Prior to Admission medications   Medication Sig Start Date End Date Taking? Authorizing Provider  cephALEXin (KEFLEX) 500 MG capsule Take 1 capsule (500 mg total) by mouth 4 (four) times daily for 7 days. 06/27/23 07/04/23 Yes Arabella Merles, PA-C  oxyCODONE-acetaminophen (PERCOCET/ROXICET) 5-325 MG tablet Take 1 tablet by mouth every 6 (six) hours as needed for up to 4 days for severe pain. 06/27/23 07/01/23 Yes Arabella Merles, PA-C  ALPRAZolam Prudy Feeler) 1 MG tablet TAKE ONE TABLET BY MOUTH TWICE DAILY AS NEEDED FOR ANXIETY 06/16/23   Corie Chiquito, PMHNP  amLODipine (NORVASC) 5 MG tablet Take 5 mg by mouth daily. Patient not taking: Reported on 05/27/2023 05/24/23 05/23/24  [provider]  amphetamine-dextroamphetamine (ADDERALL) 10 MG tablet Take 1 tablet (10 mg total) by mouth 2 (two) times daily with a meal. 05/12/23 06/11/23  Corie Chiquito, PMHNP  amphetamine-dextroamphetamine (ADDERALL) 10 MG tablet Take 1 tablet (10 mg total) by mouth 2 (two) times daily with a meal. 06/09/23 07/09/23  Corie Chiquito, PMHNP  amphetamine-dextroamphetamine (ADDERALL) 10 MG tablet Take 1 tablet (10 mg total) by mouth 2 (two)  times daily with a meal. 07/07/23 08/06/23  Corie Chiquito, PMHNP  buPROPion (WELLBUTRIN XL) 150 MG 24 hr tablet Take 1 tablet (150 mg total) by mouth daily. 03/24/23   Corie Chiquito, PMHNP  gabapentin (NEURONTIN) 300 MG capsule Take 1 capsule (300 mg total) by mouth at bedtime as needed (RLS). 05/27/23 06/26/23  Corie Chiquito, PMHNP  Multiple Vitamin (MULTIVITAMIN) tablet Take 1 tablet by mouth daily.    [provider]  sertraline (ZOLOFT) 50 MG tablet Take 1/2-1 tablet daily 05/27/23   Corie Chiquito, PMHNP  sildenafil (VIAGRA) 100 MG tablet TAKE ONE-HALF TABLET BY MOUTH DAILY AS NEEDED FOR ERECTILE DYSFUNCTION 05/27/23   Corie Chiquito, PMHNP      Allergies    Trazodone and nefazodone    Review of Systems   Review of Systems  Skin:        Laceration of the hand right and right temple    Physical Exam Updated Vital Signs BP 132/75 (BP Location: Left Arm)   Pulse 73   Temp 98.5 F (36.9 C) (Oral)   Resp 16   Ht 6\' 1"  (1.854 m)   Wt 99.8 kg   SpO2 92%   BMI 29.03 kg/m  Physical Exam Vitals and nursing note reviewed.  Constitutional:      Appearance: Normal appearance.  HENT:     Head: Normocephalic and atraumatic.  Eyes:     Extraocular Movements: Extraocular movements intact.     Pupils: Pupils are  equal, round, and reactive to light.  Cardiovascular:     Comments: Radial pulses 2+ bilaterally Pulmonary:     Effort: Pulmonary effort is normal.  Musculoskeletal:     Comments: Right hand with deep laceration across the palmar aspect, no obvious bone protrusion.  Some debris in the laceration Patient able to flex and extend the MCP, DIP, PIP of the first through fifth digit of the right hand Sensation intact in the first through fifth digits of the right hand  Patient able to flex and extend the right wrist and elbow   Skin:    Comments: Laceration to the right eyebrow approximately 4 cm  Neurological:     General: No focal deficit present.     Mental  Status: He is alert.     Cranial Nerves: No cranial nerve deficit.  Psychiatric:        Mood and Affect: Mood normal.        Behavior: Behavior normal.          ED Results / Procedures / Treatments   Labs (all labs ordered are listed, but only abnormal results are displayed) Labs Reviewed - No data to display  EKG None  Radiology DG Hand Complete Right  Result Date: 06/26/2023 CLINICAL DATA:  Laceration EXAM: RIGHT HAND - COMPLETE 3+ VIEW COMPARISON:  None Available. FINDINGS: Large laceration across the volar aspect of the hand at the level of metacarpals with punctate skin densities. Acute comminuted intra-articular fracture involving the head of the fifth metacarpal with volar displaced metacarpal head fracture fragment. IMPRESSION: 1. Laceration across the volar aspect of the hand with punctate skin densities. 2. Acute comminuted open intra-articular fracture involving the head of the fifth metacarpal with volar displaced metacarpal head fracture fragment. Electronically Signed   By: Jasmine Pang M.D.   On: 06/26/2023 21:49    Procedures .Marland KitchenLaceration Repair  Date/Time: 06/27/2023 12:43 AM  Performed by: Arabella Merles, PA-C Authorized by: Arabella Merles, PA-C   Consent:    Consent obtained:  Verbal   Consent given by:  Patient   Risks discussed:  Pain, infection, retained foreign body and need for additional repair   Alternatives discussed:  No treatment and referral Universal protocol:    Patient identity confirmed:  Verbally with patient Anesthesia:    Anesthesia method:  Local infiltration   Local anesthetic:  Lidocaine 1% w/o epi Laceration details:    Location:  Hand   Hand location:  R palm   Length (cm):  15   Depth (mm):  5 Pre-procedure details:    Preparation:  Imaging obtained to evaluate for foreign bodies Exploration:    Hemostasis achieved with:  Direct pressure   Contaminated: yes   Treatment:    Area cleansed with:  Saline and  povidone-iodine   Amount of cleaning:  Extensive   Irrigation solution:  Sterile saline   Irrigation volume:  2 liters   Irrigation method:  Syringe   Debridement:  Moderate Skin repair:    Repair method:  Sutures   Suture size:  3-0   Wound skin closure material used: Ethilon.   Suture technique:  Simple interrupted   Number of sutures:  4 Approximation:    Approximation:  Loose Post-procedure details:    Dressing:  Non-adherent dressing   Procedure completion:  Tolerated well, no immediate complications     Medications Ordered in ED Medications  acetaminophen (TYLENOL) tablet 650 mg (650 mg Oral Given 06/26/23 2020)  HYDROmorphone (DILAUDID) injection 1 mg (  1 mg Intramuscular Given 06/26/23 2119)  ceFAZolin (ANCEF) IVPB 2g/100 mL premix (0 g Intravenous Stopped 06/26/23 2223)  Tdap (BOOSTRIX) injection 0.5 mL (0.5 mLs Intramuscular Given 06/26/23 2214)  morphine (PF) 2 MG/ML injection 1 mg (1 mg Intravenous Given 06/26/23 2229)  lidocaine (PF) (XYLOCAINE) 1 % injection 30 mL (30 mLs Infiltration Given by Other 06/26/23 2306)  HYDROmorphone (DILAUDID) injection 2 mg (2 mg Intravenous Given 06/26/23 2335)  ketorolac (TORADOL) 30 MG/ML injection 30 mg (30 mg Intravenous Given 06/26/23 2330)  lidocaine-EPINEPHrine (XYLOCAINE W/EPI) 2 %-1:200000 (PF) injection 20 mL (20 mLs Intradermal Given by Other 06/27/23 9562)    ED Course/ Medical Decision Making/ A&P Clinical Course as of 06/27/23 0106  Sat Jun 26, 2023  1671 47 year old right-hand-dominant male fell with significant laceration to his right palm along with facial laceration.  He had laceration demonstrating FDS and FDP intact.  Normal sensation to light touch.  Will need IV antibiotics tetanus update x-rays and discussion with hand surgery.  Appears to be contaminated with some gravel. [MB]    Clinical Course User Index [MB] Terrilee Files, MD                             Medical Decision Making Amount and/or Complexity of  Data Reviewed Radiology: ordered.  Risk Prescription drug management.   47 y.o. male presents to the ED for concern of laceration to his hand and right temple  Differential diagnosis includes but is not limited to hand laceration, tendon injury, fracture, dislocation  ED Course:  Patient presents with extensive hand laceration.  Able to flex and extend at the MCP, PIP, DIP of the first through fifth digits of the right hand, no concern for tendon injury at this time.  He is neurovascularly intact.  His x-ray shows an intra-articular fracture involving the head of the fifth metacarpal.  Consulted hand surgery and they recommend irrigating hand with normal saline and closing with 4 loose stitches.  They also recommend placing patient in ulnar gutter splint. Will discharge on Keflex. Patient hand laceration closed with 4 stitches. Right eyebrow laceration closed with 3 stitches.  Patient tolerated this well. Right eyebrow laceration closed by Antony Madura PA-C and I closed the hand laceration. Patient given 2g Ancef here in the ER Patient pain with moderate control with IV morphine, Dilaudid, Toradol and local anesthetic.  Impression: Right hand laceration Eyebrow laceration Right hand open comminuted intra-articular fracture of the head of the fifth metacarpal   Disposition:  Patient discharged home in ulnar gutter splint with instructions to follow-up with hand surgery within the next week.  He was given a course of Keflex and prescribed Percocet take at home if he has breakthrough pain after taking ibuprofen. Return precautions given.  Lab Tests: Not indicated  Imaging Studies ordered: I ordered imaging studies including x ray right hand   I independently visualized the imaging with scope of interpretation limited to determining acute life threatening conditions related to emergency care. Imaging showed  Acute comminuted open intra-articular fracture involving the head of the fifth  metacarpal with volar displaced metacarpal head fracture fragment. I agree with the radiologist interpretation   Cardiac Monitoring: / EKG: Not indicated   Consultations Obtained: I requested consultation with hand surgery Dr Everardo Beals and discussed lab and imaging findings as well as pertinent plan - they recommend: Irrigation of the wound with 2 to 3 L normal saline, and close loosely  with 4 loose stitches.  He recommends patient be placed in ulnar gutter splint upon discharge and follow-up outpatient with the hand team this week.  He understands this is an open fracture and dirty wound. He recommended discharging patient with Keflex.     Co morbidities that complicate the patient evaluation  None  Social Determinants of Health:  Unknown              Final Clinical Impression(s) / ED Diagnoses Final diagnoses:  Laceration of deep palmar arch of right hand, initial encounter  Eyebrow laceration, right, initial encounter  Closed displaced fracture of fifth metacarpal bone of right hand, unspecified portion of metacarpal, initial encounter    Rx / DC Orders ED Discharge Orders          Ordered    cephALEXin (KEFLEX) 500 MG capsule  4 times daily,   Status:  Discontinued        06/27/23 0055    cephALEXin (KEFLEX) 500 MG capsule  4 times daily        06/27/23 0057    oxyCODONE-acetaminophen (PERCOCET/ROXICET) 5-325 MG tablet  Every 6 hours PRN        06/27/23 0106              Arabella Merles, PA-C 06/27/23 0057    Arabella Merles, PA-C 06/27/23 0107    Terrilee Files, MD 06/27/23 0944    Arabella Merles, PA-C 06/27/23 1229    Terrilee Files, MD 06/28/23 [phone]

## 2023-06-26 NOTE — ED Triage Notes (Signed)
The pt slipped off rocks and onto some rocks  one hour ago  he has a laceration to his rt eyebrow and a large laceration to the palmer surface of his rt hand

## 2023-06-26 NOTE — ED Notes (Signed)
The pt is also c/o a dogbite to his rt upper arm earlier today also  he has bruising to his rt upper arm

## 2023-06-27 MED ORDER — OXYCODONE-ACETAMINOPHEN 5-325 MG PO TABS
1.0000 | ORAL_TABLET | Freq: Once | ORAL | Status: AC
  Administered 2023-06-27: 1 via ORAL
  Filled 2023-06-27: qty 1

## 2023-06-27 MED ORDER — CEPHALEXIN 500 MG PO CAPS: 500.0000 mg | ORAL_CAPSULE | Freq: Four times a day (QID) | ORAL | 0 refills | Status: DC

## 2023-06-27 MED ORDER — OXYCODONE-ACETAMINOPHEN 5-325 MG PO TABS: 1.0000 | ORAL_TABLET | Freq: Four times a day (QID) | ORAL | 0 refills | Status: AC | PRN

## 2023-06-27 MED ORDER — LIDOCAINE-EPINEPHRINE (PF) 2 %-1:200000 IJ SOLN: 20.0000 mL | Freq: Once | INTRAMUSCULAR | Status: AC

## 2023-06-27 MED ORDER — CEPHALEXIN 500 MG PO CAPS: 500.0000 mg | ORAL_CAPSULE | Freq: Four times a day (QID) | ORAL | 0 refills | Status: AC

## 2023-06-27 MED ORDER — LIDOCAINE-EPINEPHRINE (PF) 2 %-1:200000 IJ SOLN
INTRAMUSCULAR | Status: AC
  Administered 2023-06-27: 20 mL via INTRADERMAL
  Filled 2023-06-27: qty 20

## 2023-06-27 MED ORDER — IBUPROFEN 800 MG PO TABS
800.0000 mg | ORAL_TABLET | Freq: Once | ORAL | Status: AC
  Administered 2023-06-27: 800 mg via ORAL
  Filled 2023-06-27: qty 1

## 2023-06-27 NOTE — ED Notes (Signed)
Paged ortho for splint placement. Will be here shortly.

## 2023-06-27 NOTE — ED Provider Notes (Addendum)
..  Laceration Repair  Date/Time: 06/27/2023 1:03 AM  Performed by: Antony Madura, PA-C Authorized by: Antony Madura, PA-C   Consent:    Consent obtained:  Verbal   Consent given by:  Patient   Risks, benefits, and alternatives were discussed: yes     Risks discussed:  Poor cosmetic result, poor wound healing, need for additional repair and pain Universal protocol:    Procedure explained and questions answered to patient or proxy's satisfaction: yes     Relevant documents present and verified: yes     Test results available: yes     Imaging studies available: yes     Required blood products, implants, devices, and special equipment available: yes     Site/side marked: yes     Immediately prior to procedure, a time out was called: yes     Patient identity confirmed:  Verbally with patient and arm band Anesthesia:    Anesthesia method:  Local infiltration   Local anesthetic:  Lidocaine 2% WITH epi Laceration details:    Location:  Face   Face location:  R eyebrow   Length (cm):  4 Pre-procedure details:    Preparation:  Patient was prepped and draped in usual sterile fashion Exploration:    Hemostasis achieved with:  Direct pressure Treatment:    Area cleansed with:  Povidone-iodine   Amount of cleaning:  Standard   Visualized foreign bodies/material removed: no     Debridement:  None Skin repair:    Repair method:  Sutures   Suture size:  5-0   Suture material:  Prolene   Suture technique:  Simple interrupted   Number of sutures:  3 Approximation:    Approximation:  Close Repair type:    Repair type:  Simple Post-procedure details:    Dressing:  Open (no dressing)   Procedure completion:  Tolerated well, no immediate complications     Antony Madura, PA-C 06/27/23 0106    Tilden Fossa, MD 06/27/23 0310    Arabella Merles, PA-C 06/27/23 1222    Tilden Fossa, MD 06/27/23 2244

## 2023-06-27 NOTE — ED Notes (Signed)
Patient reports to feeling better at this time. States pain has decreased to 8/10 at this time.

## 2023-06-27 NOTE — Progress Notes (Signed)
Orthopedic Tech Progress Note Patient Details:  Aaron West 1976-09-29 [id number]  Ortho Devices Type of Ortho Device: Ulna gutter splint Ortho Device/Splint Location: rue Ortho Device/Splint Interventions: Ordered, Application, Adjustment  I applied the splint to the patient, the patient had no pain during application. Post Interventions Patient Tolerated: Well Instructions Provided: Care of device, Adjustment of device  Trinna Post 06/27/2023, 1:58 AM

## 2023-06-27 NOTE — Discharge Instructions (Addendum)
You have been seen in the ER today for your hand laceration and right eyebrow laceration.    You will need to go to your primary care or urgent care to get your eyebrow sutures removed in 5 days.  You need to make an appointment with the hand surgeon listed above within the next week.  They will further evaluate your hand and determine next steps in management.  Please keep your splint clean and dry.  You have been prescribed Keflex. Take this antibiotic 4 times a day for the next 7 days. Take the full course of your antibiotic even if you start feeling better. Antibiotics may cause you to have diarrhea.   You have been prescribed Percocet for pain.  You may take this medication every 6 hours as needed for severe pain.  Do not take any Tylenol as this medication contains Tylenol.  Do not drive or operate heavy machinery on this medication.  You may also use up to 800mg  ibuprofen every 6 hours as needed for pain.  Return to the ER should you develop fever, chills, pus drainage from your wound, redness around your wound.

## 2023-09-20 ENCOUNTER — Other Ambulatory Visit: Payer: Self-pay

## 2023-09-20 ENCOUNTER — Telehealth: Payer: Self-pay | Admitting: Psychiatry

## 2023-09-20 DIAGNOSIS — F902 Attention-deficit hyperactivity disorder, combined type: Secondary | ICD-10-CM

## 2023-09-20 MED ORDER — AMPHETAMINE-DEXTROAMPHETAMINE 10 MG PO TABS
10.0000 mg | ORAL_TABLET | Freq: Two times a day (BID) | ORAL | 0 refills | Status: DC
Start: 2023-09-20 — End: 2023-11-03

## 2023-09-20 NOTE — Telephone Encounter (Signed)
Pt called in and LM  2pm. Please refill the Adderall 10mg  RF.  Send to : [facility] [id number] - [place], [place] [phone]  Appt 12/01/23

## 2023-09-20 NOTE — Telephone Encounter (Signed)
Pended.

## 2023-10-27 ENCOUNTER — Encounter: Payer: Self-pay | Admitting: Psychiatry

## 2023-11-03 ENCOUNTER — Other Ambulatory Visit: Payer: Self-pay

## 2023-11-03 ENCOUNTER — Telehealth: Payer: Self-pay | Admitting: Psychiatry

## 2023-11-03 DIAGNOSIS — F902 Attention-deficit hyperactivity disorder, combined type: Secondary | ICD-10-CM

## 2023-11-03 MED ORDER — AMPHETAMINE-DEXTROAMPHETAMINE 10 MG PO TABS
10.0000 mg | ORAL_TABLET | Freq: Two times a day (BID) | ORAL | 0 refills | Status: AC
Start: 2023-11-03 — End: 2023-12-03

## 2023-11-03 MED ORDER — AMPHETAMINE-DEXTROAMPHETAMINE 10 MG PO TABS
10.0000 mg | ORAL_TABLET | Freq: Two times a day (BID) | ORAL | 0 refills | Status: AC
Start: 2023-12-01 — End: 2023-12-31

## 2023-11-03 MED ORDER — AMPHETAMINE-DEXTROAMPHETAMINE 10 MG PO TABS
10.0000 mg | ORAL_TABLET | Freq: Two times a day (BID) | ORAL | 0 refills | Status: AC
Start: 2023-12-29 — End: 2024-01-28

## 2023-11-03 NOTE — Telephone Encounter (Signed)
Please call to see if you can schedule him before Jessica leaves. He had appt for Dec, but it was canceled by provider.

## 2023-11-03 NOTE — Telephone Encounter (Signed)
Pt is requesting RF on Adderall. Please send to: [facility] [id number] [place], [place] - [phone]  [address],

## 2023-11-03 NOTE — Telephone Encounter (Signed)
Pended 3 RF of Adderall to requested pharmacy.

## 2023-11-26 ENCOUNTER — Other Ambulatory Visit: Payer: Self-pay | Admitting: Psychiatry

## 2023-11-26 DIAGNOSIS — F411 Generalized anxiety disorder: Secondary | ICD-10-CM

## 2023-12-01 ENCOUNTER — Ambulatory Visit: Payer: BC Managed Care – PPO | Admitting: Psychiatry
